# Patient Record
Sex: Male | Born: 1965 | Race: White | Hispanic: No | Marital: Married | Smoking: Current every day smoker
Health system: Southern US, Community
[De-identification: ages and names within clinical notes are randomized; demographics above are authoritative.]

## PROBLEM LIST (undated history)

## (undated) DIAGNOSIS — I10 Essential (primary) hypertension: Secondary | ICD-10-CM

## (undated) DIAGNOSIS — K7460 Unspecified cirrhosis of liver: Secondary | ICD-10-CM

---

## 2007-10-09 ENCOUNTER — Ambulatory Visit: Payer: Self-pay | Admitting: Orthopedic Surgery

## 2007-11-06 ENCOUNTER — Ambulatory Visit (HOSPITAL_COMMUNITY): Admission: RE | Admit: 2007-11-06 | Discharge: 2007-11-07 | Payer: Self-pay | Admitting: Neurological Surgery

## 2007-12-13 ENCOUNTER — Encounter: Admission: RE | Admit: 2007-12-13 | Discharge: 2007-12-13 | Payer: Self-pay | Admitting: Neurological Surgery

## 2008-02-17 ENCOUNTER — Encounter: Admission: RE | Admit: 2008-02-17 | Discharge: 2008-02-17 | Payer: Self-pay | Admitting: Neurological Surgery

## 2008-02-20 ENCOUNTER — Ambulatory Visit: Payer: Self-pay | Admitting: Neurological Surgery

## 2010-04-18 ENCOUNTER — Emergency Department: Payer: Self-pay | Admitting: Emergency Medicine

## 2010-04-25 ENCOUNTER — Ambulatory Visit: Payer: Self-pay | Admitting: General Surgery

## 2010-04-30 ENCOUNTER — Emergency Department: Payer: Self-pay | Admitting: Unknown Physician Specialty

## 2010-05-06 ENCOUNTER — Ambulatory Visit: Payer: Self-pay | Admitting: Gastroenterology

## 2010-05-09 ENCOUNTER — Ambulatory Visit: Payer: Self-pay | Admitting: Gastroenterology

## 2010-05-12 LAB — PATHOLOGY REPORT

## 2010-06-08 ENCOUNTER — Encounter: Payer: Self-pay | Admitting: Anesthesiology

## 2010-07-06 ENCOUNTER — Encounter: Payer: Self-pay | Admitting: Anesthesiology

## 2010-08-04 ENCOUNTER — Encounter: Payer: Self-pay | Admitting: Anesthesiology

## 2010-10-18 NOTE — Op Note (Signed)
Jonathon Bryant, Jonathon Bryant              ACCOUNT NO.:  [id number]   MEDICAL RECORD NO.:  [id number]          PATIENT TYPE:  AMB   LOCATION:  SDS                          FACILITY:  [facility]   PHYSICIAN:  Tia Alert, MD     DATE OF BIRTH:  10-05-1965   DATE OF PROCEDURE:  11/06/2007  DATE OF DISCHARGE:                               OPERATIVE REPORT   PREOPERATIVE DIAGNOSES:  Cervical spondylosis with cervical spinal  stenosis, C5-C6 with left C6 radiculopathy.   POSTOPERATIVE DIAGNOSES:  Cervical spondylosis with cervical spinal  stenosis, C5-C6 with left C6 radiculopathy.   PROCEDURES:  1. Decompressive anterior cervical diskectomy, C5-C6.  2. Anterior cervical arthrodesis, C5-C6 utilizing a 7-mm      corticocancellous allograft.  3. Anterior cervical plating, C5-C6 utilizing a 24-mm NuVasive helix      plate.   SURGEON:  Tia Alert, MD   ASSISTANT:  Donalee Citrin, MD   ANESTHESIA:  General endotracheal.   COMPLICATIONS:  None apparent.   INDICATIONS FOR PROCEDURE:  Jonathon Bryant is a 45 year old gentleman who  presented with severe left arm pain.  He had an MRI, which showed a  significant spondylosis with severe neural foraminal stenosis at C5-C6.  He had tried medical management for quite some time without significant  relief, recommended decompressive anterior cervical diskectomy, fusion  with plating at C5-C6.  He understood the risks, benefits, expected  outcome, and wished to proceed.   DESCRIPTION OF PROCEDURE:  The patient was taken to the operating room,  and after induction of adequate generalized endotracheal anesthesia, he  was placed in supine position on the operating room table.  His right  anterior cervical region was prepped with DuraPrep and then draped in  the usual sterile fashion.  A 5 mL of local anesthesia was injected.  A  transverse incision was made to the right of midline and carried down to  the platysma, which was elevated, opened, and undermined  with Metzenbaum  scissors.  I then dissected a plane medial to the sternocleidomastoid  muscle and internal carotid artery, and lateral to the trachea and  esophagus to expose C5-C6.  Intraoperative fluoroscopy confirmed my  level and then, the longus colli muscles were taken down.  The Shadow-  Line retractors were placed under this.  There was a large overhanging  spur from C5 over the disk space.  This was removed with a 3-mm Kerrison  punch.  The annulus was incised and initial diskectomy was done with  pituitary rongeurs and curved curettes.  I then used a high-speed drill  to drill the endplates down to the level of the posterior longitudinal  ligament, widening the disk space to 7 or 8 mm.  As I went down, I  drilled in a rectangular fashion.  Once we then brought in the operating  microscope, we used a 1- and 2-mm Kerrison punches to open the posterior  longitudinal ligament and then removed it by undercutting the bodies of  C5 and C6.  There was an osteophyte coming off the body of C5, which was  removed by undercutting  the bodies of C5.  Bilateral foraminotomies were  performed by marching along the C6 superior endplate, identifying the  pedicles, then marching along the pedicles, identifying the nerve roots,  and then following them out into their respective foramina.  Once the  depression was complete, we palpated with a black nerve hook into the  foramina and into the midline.  We could see the cord pulsatile through  the dura and the nerve roots appeared to be free.  The nerve root passed  easily along the nerve roots.  Therefore, we irrigated with saline  solution, we measured the interspace to be 7 mm, we used a 7-mm  corticocancellous allograft, and tapped this in position at C5-C6.  We  then used a 24-mm NuVasive helix plate and placed two 30-mm variable  angle screws in the bodies of C5 and C6, and these locked in the plate  by locking mechanism within the plate.  We  then irrigated with saline  solution, dried all bleeding points with bipolar cautery and Surgifoam,  placed a 7 flat JP drain through a separate stab incision, and once  meticulous hemostasis was achieved, we closed the platysma with 3-0  Vicryl, closed the subcuticular tissue with 3-0 Vicryl, and closed the  skin with benzoin and Steri-Strips.  The drapes were removed.  Sterile  dressing was applied.  The patient was awakened from general anesthesia  and transferred to recovery room in stable condition.  At the end of the  procedure, all sponge, needle, and instrument counts were correct.      Tia Alert, MD  Electronically Signed     DSJ/MEDQ  D:  11/06/2007  T:  11/07/2007  Job:  [id number]

## 2011-02-25 ENCOUNTER — Emergency Department (HOSPITAL_COMMUNITY)
Admission: EM | Admit: 2011-02-25 | Discharge: 2011-02-25 | Disposition: A | Discharge status: Home / Self Care | Payer: Worker's Compensation | Attending: Emergency Medicine | Admitting: Emergency Medicine

## 2011-02-25 ENCOUNTER — Emergency Department (HOSPITAL_COMMUNITY): Payer: Worker's Compensation

## 2011-02-25 DIAGNOSIS — Z79899 Other long term (current) drug therapy: Secondary | ICD-10-CM | POA: Insufficient documentation

## 2011-02-25 DIAGNOSIS — F17200 Nicotine dependence, unspecified, uncomplicated: Secondary | ICD-10-CM | POA: Insufficient documentation

## 2011-02-25 DIAGNOSIS — X500XXA Overexertion from strenuous movement or load, initial encounter: Secondary | ICD-10-CM | POA: Insufficient documentation

## 2011-02-25 DIAGNOSIS — G8929 Other chronic pain: Secondary | ICD-10-CM | POA: Insufficient documentation

## 2011-02-25 DIAGNOSIS — M25519 Pain in unspecified shoulder: Secondary | ICD-10-CM | POA: Insufficient documentation

## 2011-02-26 ENCOUNTER — Inpatient Hospital Stay (INDEPENDENT_AMBULATORY_CARE_PROVIDER_SITE_OTHER)
Admission: RE | Admit: 2011-02-26 | Discharge: 2011-02-26 | Disposition: A | Discharge status: Home / Self Care | Payer: Worker's Compensation | Source: Ambulatory Visit | Attending: Emergency Medicine | Admitting: Emergency Medicine

## 2011-02-26 DIAGNOSIS — M25519 Pain in unspecified shoulder: Secondary | ICD-10-CM

## 2011-03-02 LAB — BASIC METABOLIC PANEL
BUN: 14
Calcium: 9.6
Creatinine, Ser: 1.14
GFR calc non Af Amer: >60

## 2011-03-02 LAB — CBC
Platelets: 200
WBC: 11.2 — ABNORMAL HIGH

## 2011-03-02 LAB — DIFFERENTIAL
Eosinophils Absolute: 0.2
Lymphocytes Relative: 33
Lymphs Abs: 3.7
Neutrophils Relative %: 58

## 2011-03-02 LAB — PROTIME-INR
INR: 1.1
Prothrombin Time: 14.1

## 2011-07-12 ENCOUNTER — Ambulatory Visit: Payer: Self-pay | Admitting: Family Medicine

## 2012-01-15 ENCOUNTER — Ambulatory Visit: Payer: Self-pay

## 2012-04-19 ENCOUNTER — Emergency Department: Payer: Self-pay | Admitting: Emergency Medicine

## 2013-04-04 ENCOUNTER — Emergency Department: Payer: Self-pay | Admitting: Emergency Medicine

## 2013-04-04 LAB — COMPREHENSIVE METABOLIC PANEL
BUN: 6 mg/dL — ABNORMAL LOW (ref 7–18)
Creatinine: 1.03 mg/dL (ref 0.60–1.30)
EGFR (African American): >60
EGFR (Non-African Amer.): >60
Glucose: 93 mg/dL (ref 65–99)
Potassium: 3.7 mmol/L (ref 3.5–5.1)
SGOT(AST): 50 U/L — ABNORMAL HIGH (ref 15–37)

## 2013-04-04 LAB — CBC
HCT: 44.9 % (ref 40.0–52.0)
RBC: 4.53 10*6/uL (ref 4.40–5.90)

## 2014-03-23 ENCOUNTER — Emergency Department: Payer: Self-pay | Admitting: Emergency Medicine

## 2014-03-23 LAB — COMPREHENSIVE METABOLIC PANEL
ALBUMIN: 2.5 g/dL — AB (ref 3.4–5.0)
ALT: 35 U/L
AST: 61 U/L — AB (ref 15–37)
Alkaline Phosphatase: 180 U/L — ABNORMAL HIGH
Anion Gap: 7 (ref 7–16)
BUN: 8 mg/dL (ref 7–18)
Bilirubin,Total: 1.5 mg/dL — ABNORMAL HIGH (ref 0.2–1.0)
CALCIUM: 8 mg/dL — AB (ref 8.5–10.1)
CHLORIDE: 99 mmol/L (ref 98–107)
CREATININE: 0.91 mg/dL (ref 0.60–1.30)
Co2: 26 mmol/L (ref 21–32)
EGFR (African American): >60
EGFR (Non-African Amer.): >60
Glucose: 164 mg/dL — ABNORMAL HIGH (ref 65–99)
Osmolality: 266 (ref 275–301)
POTASSIUM: 3.9 mmol/L (ref 3.5–5.1)
Sodium: 132 mmol/L — ABNORMAL LOW (ref 136–145)
TOTAL PROTEIN: 6.9 g/dL (ref 6.4–8.2)

## 2014-03-23 LAB — CBC WITH DIFFERENTIAL/PLATELET
BASOS ABS: 0 10*3/uL (ref 0.0–0.1)
BASOS PCT: 0.5 %
Eosinophil #: 0.5 10*3/uL (ref 0.0–0.7)
Eosinophil %: 5.9 %
HCT: 40.1 % (ref 40.0–52.0)
HGB: 13.4 g/dL (ref 13.0–18.0)
LYMPHS PCT: 13.8 %
Lymphocyte #: 1.1 10*3/uL (ref 1.0–3.6)
MCH: 34.2 pg — ABNORMAL HIGH (ref 26.0–34.0)
MCHC: 33.5 g/dL (ref 32.0–36.0)
MCV: 102 fL — ABNORMAL HIGH (ref 80–100)
MONO ABS: 1 x10 3/mm (ref 0.2–1.0)
Monocyte %: 13.1 %
NEUTROS ABS: 5.2 10*3/uL (ref 1.4–6.5)
NEUTROS PCT: 66.7 %
Platelet: 111 10*3/uL — ABNORMAL LOW (ref 150–440)
RBC: 3.92 10*6/uL — AB (ref 4.40–5.90)
RDW: 14.6 % — AB (ref 11.5–14.5)
WBC: 7.8 10*3/uL (ref 3.8–10.6)

## 2014-03-23 LAB — LIPASE, BLOOD: LIPASE: 161 U/L (ref 73–393)

## 2014-08-30 ENCOUNTER — Observation Stay: Payer: Self-pay | Admitting: Internal Medicine

## 2014-08-30 LAB — URINALYSIS, COMPLETE
BILIRUBIN, UR: NEGATIVE
BLOOD: NEGATIVE
Bacteria: NONE SEEN
Glucose,UR: NEGATIVE mg/dL (ref 0–75)
Ketone: NEGATIVE
LEUKOCYTE ESTERASE: NEGATIVE
NITRITE: NEGATIVE
PH: 6 (ref 4.5–8.0)
Protein: NEGATIVE
RBC,UR: 1 /HPF (ref 0–5)
SQUAMOUS EPITHELIAL: NONE SEEN
Specific Gravity: 1.014 (ref 1.003–1.030)
WBC UR: <1 /HPF (ref 0–5)

## 2014-08-30 LAB — COMPREHENSIVE METABOLIC PANEL
ALBUMIN: 3.4 g/dL — AB
ALK PHOS: 186 U/L — AB
ANION GAP: 7 (ref 7–16)
BILIRUBIN TOTAL: 1.4 mg/dL — AB
BUN: 9 mg/dL
CALCIUM: 8.6 mg/dL — AB
CREATININE: 1.1 mg/dL
Chloride: 97 mmol/L — ABNORMAL LOW
Co2: 28 mmol/L
EGFR (Non-African Amer.): >60
GLUCOSE: 150 mg/dL — AB
POTASSIUM: 3.1 mmol/L — AB
SGOT(AST): 65 U/L — ABNORMAL HIGH
SGPT (ALT): 34 U/L
SODIUM: 132 mmol/L — AB
TOTAL PROTEIN: 7.4 g/dL

## 2014-08-30 LAB — CBC WITH DIFFERENTIAL/PLATELET
BASOS ABS: 0 10*3/uL (ref 0.0–0.1)
Basophil %: 0.7 %
EOS ABS: 0.3 10*3/uL (ref 0.0–0.7)
Eosinophil %: 5 %
HCT: 39.8 % — AB (ref 40.0–52.0)
HGB: 13.9 g/dL (ref 13.0–18.0)
LYMPHS ABS: 1.1 10*3/uL (ref 1.0–3.6)
LYMPHS PCT: 15.3 %
MCH: 34.4 pg — ABNORMAL HIGH (ref 26.0–34.0)
MCHC: 34.8 g/dL (ref 32.0–36.0)
MCV: 99 fL (ref 80–100)
MONO ABS: 0.8 x10 3/mm (ref 0.2–1.0)
Monocyte %: 11.1 %
NEUTROS ABS: 4.7 10*3/uL (ref 1.4–6.5)
Neutrophil %: 67.9 %
Platelet: 107 10*3/uL — ABNORMAL LOW (ref 150–440)
RBC: 4.03 10*6/uL — ABNORMAL LOW (ref 4.40–5.90)
RDW: 15.5 % — AB (ref 11.5–14.5)
WBC: 6.9 10*3/uL (ref 3.8–10.6)

## 2014-08-30 LAB — TROPONIN I

## 2014-08-30 LAB — AMMONIA: AMMONIA, PLASMA: 58 umol/L — AB

## 2014-08-30 LAB — LIPASE, BLOOD: LIPASE: 49 U/L

## 2014-08-31 LAB — CBC WITH DIFFERENTIAL/PLATELET
BASOS ABS: 0.1 10*3/uL (ref 0.0–0.1)
Basophil %: 0.8 %
EOS ABS: 0.4 10*3/uL (ref 0.0–0.7)
Eosinophil %: 5.9 %
HCT: 37.2 % — ABNORMAL LOW (ref 40.0–52.0)
HGB: 12.5 g/dL — AB (ref 13.0–18.0)
LYMPHS PCT: 15.9 %
Lymphocyte #: 1.1 10*3/uL (ref 1.0–3.6)
MCH: 33.8 pg (ref 26.0–34.0)
MCHC: 33.5 g/dL (ref 32.0–36.0)
MCV: 101 fL — ABNORMAL HIGH (ref 80–100)
MONOS PCT: 10.3 %
Monocyte #: 0.7 x10 3/mm (ref 0.2–1.0)
NEUTROS PCT: 67.1 %
Neutrophil #: 4.7 10*3/uL (ref 1.4–6.5)
Platelet: 96 10*3/uL — ABNORMAL LOW (ref 150–440)
RBC: 3.69 10*6/uL — ABNORMAL LOW (ref 4.40–5.90)
RDW: 15.7 % — AB (ref 11.5–14.5)
WBC: 7.1 10*3/uL (ref 3.8–10.6)

## 2014-08-31 LAB — BASIC METABOLIC PANEL
ANION GAP: 5 — AB (ref 7–16)
BUN: 12 mg/dL
CALCIUM: 8.4 mg/dL — AB
Chloride: 98 mmol/L — ABNORMAL LOW
Co2: 29 mmol/L
Creatinine: 1.11 mg/dL
EGFR (African American): >60
EGFR (Non-African Amer.): >60
GLUCOSE: 126 mg/dL — AB
Potassium: 3.3 mmol/L — ABNORMAL LOW
SODIUM: 132 mmol/L — AB

## 2014-10-04 NOTE — H&P (Signed)
PATIENT NAME:  Jonathon, Bryant MR#:  [id number] DATE OF BIRTH:  Oct 28, 1965  DATE OF ADMISSION:  08/30/2014    PRIMARY CARE PHYSICIAN: At [facility].   CHIEF COMPLAINT: Abdominal pain.   HISTORY OF PRESENT ILLNESS: A 49 year old Caucasian gentleman with history of NASH with subsequent cirrhosis; hypertension, essential; presenting with abdominal pain described as 1 week total duration of progressively worsening pain of the abdomen, nonradiating, described only as pain, intensity 8 to 10 out of 10. No worsening or relieving factors. No fevers, chills, or other symptomatology. The patient states he gained about 20 pounds over the last week; however, had a paracentesis of a large volume in the past performed at [facility].   REVIEW OF SYSTEMS:  CONSTITUTIONAL: Denies fevers, chills, fatigue, weakness.  EYES: Denies blurred vision, double vision, eye pain.  EARS, NOSE, AND THROAT: Denies tinnitus, ear pain, hearing loss.  RESPIRATORY: Denies cough, wheeze, shortness of breath.  CARDIOVASCULAR: Denies chest pain, palpitations, edema.  GASTROINTESTINAL: Denies nausea, vomiting, diarrhea. Positive of abdominal pain as stated above.  GENITOURINARY: Denies dysuria or hematuria.  ENDOCRINE: Denies nocturia or thyroid problems.  HEMATOLOGIC AND LYMPHATIC: Denies easy bruising or bleeding.  SKIN: Denies rash or lesions.  MUSCULOSKELETAL: Denies pain in neck, back, shoulder, knees, hips, or arthritic symptoms.  NEUROLOGIC: Denies paralysis or paresthesias. PSYCHIATRIC: Denies anxiety or depressive symptoms.   Otherwise, a full review of systems performed by me is negative.   PAST MEDICAL HISTORY: Includes NASH with subsequent cirrhosis, status post paracentesis; essential hypertension.   SOCIAL HISTORY: Positive for tobacco use. Denies any alcohol or drug use.   FAMILY HISTORY: Denies any known cardiovascular or pulmonary disorders.   ALLERGIES: No known drug allergies.   HOME MEDICATIONS: Include  gabapentin 100 mg 3 capsules 3 times daily, hydrochlorothiazide/lisinopril 12.5/20 mg p.o. daily, Norvasc 10 mg p.o. daily.   PHYSICAL EXAMINATION:  VITAL SIGNS: Temperature 98.2, heart rate 91, respirations 16, blood pressure 161/71, saturating 100% on room air. Weight 134.7 kg, BMI 41.4.  GENERAL: A well-nourished, well-developed Caucasian gentleman currently in no acute distress.  HEAD: Normocephalic, atraumatic.  EYES: Pupils equal, round, reactive to light. Extraocular muscles intact. No sclerae icterus.  MOUTH: Moist mucosal membrane. Dentition intact. No abscess noted.  EARS, NOSE, AND THROAT: Clear without exudate. No external lesions.  NECK: Supple. No thyromegaly. No nodules. No JVD.  PULMONARY: Clear to auscultation bilaterally without wheezes, rales, or rhonchi. No use of accessory muscles. Good respiratory effort.  CHEST: Nontender to palpation. CARDIOVASCULAR: S1, S2, regular rate and rhythm. No murmurs, rubs, or gallops. Trace edema to the shins bilaterally. Pedal pulses 2+ bilateral. GASTROINTESTINAL: Tense, distended. Positive fluid wave. Hypoactive bowel sounds. No appreciable hepatosplenomegaly, but difficult examination.  MUSCULOSKELETAL: No swelling, clubbing, or edema. Range of motion full in all extremities.  NEUROLOGIC: Cranial nerves II through XII. No gross focal neurological deficits. Sensation intact. Reflexes intact.  SKIN: No ulcerations, lesions, rashes, or cyanosis. Skin warm, dry. Turgor intact.  PSYCHIATRIC: Mood and affect within normal limits. The patient is awake, alert, oriented x 3. Insight and judgment intact.   LABORATORY DATA: Sodium 132, potassium 3.1, chloride 97, bicarbonate 28, BUN 9, creatinine 1.1, glucose 150. LFTs: Albumin 3.4, bilirubin 1.4, alkaline phosphatase 186, AST 65. WBC of 6.9, hemoglobin 13.9, platelets of 107,000. Urinalysis negative for evidence of infection.   CT abdomen and pelvis performed which reveals no acute findings, evidence  of cirrhosis and portal hypertension, as well as ascites.    ASSESSMENT AND PLAN: A 49 year old  Caucasian male gentleman with history of nonalcoholic steatohepatitis with subsequent cirrhosis who presents with abdominal pain.  1.  Ascites secondary to cirrhosis: We will get a therapeutic paracentesis. Provide pain medications as required. [facility] We will have him followup whenever he is discharged.   2.  Hyponatremia in the setting of volume overload: Continue with paracentesis.  3.  Hypokalemia: Replace potassium, goal 4 to 5.  4.  Hypertension, essential: Continue with Norvasc, lisinopril, hydrochlorothiazide.  5.  Venous thromboembolism prophylaxis: Sequential compression devices.   CODE STATUS: The patient is a FULL CODE.   TIME SPENT: 35 minutes.    ____________________________ Cletis Athensavid K. Anihya Tuma, MD dkh:TT D: 08/30/2014 22:13:07 ET T: 08/30/2014 22:52:02 ET JOB#: [id number]  cc: Cletis Athensavid K. Dewaun Kinzler, MD, <Dictator> Rital Cavey Synetta ShadowK Lonette Stevison MD ELECTRONICALLY SIGNED 08/31/2014 23:42

## 2014-10-04 NOTE — Discharge Summary (Signed)
PATIENT NAME:  Jonathon Bryant, Jonathon Bryant MR#:  [id number] DATE OF BIRTH:  12/29/1965  DATE OF ADMISSION:  08/30/2014 DATE OF DISCHARGE:  08/31/2014  For detailed note, please see the history and physical done on admission by Dr. Angelica Ranavid Hower.   DIAGNOSES AT DISCHARGE: As follows:  1.  Abdominal pain and distention secondary to tense ascites.  2.  Liver cirrhosis secondary to a nonalcoholic steatohepatitis. 3.  Hyponatremia. 4.  Hypokalemia. 5.  Hypertension. 6.  Neuropathy.   DIET:  The patient is being discharged on a low-sodium, low-fat diet.   ACTIVITY: As tolerated.   FOLLOW-UP:  [facility] in the next week to 2 weeks.     DISCHARGE MEDICATIONS:  Gabapentin 100 mg 3 capsules t.i.d., amlodipine 10 mg daily, hydrochlorothiazide lisinopril 12.5-20 at 1 tablet daily, Lasix 40 mg daily, Aldactone 25 mg daily and oxycodone 5 mg every 6 hours as needed.   PERTINENT STUDIES DONE DURING THE HOSPITAL COURSE:  Ultrasound-guided paracentesis done on 08/31/2014 with 2.6 liters of straw-colored fluid removed.   BRIEF HOSPITAL COURSE: This is a 49 year old male with medical problems as mentioned above, presented to the hospital with abdominal pain, distention, and noted to have tense ascites.  1.  Abdominal pain and distention. The most likely cause of this was the tense ascites. The patient was admitted to the hospital, underwent ultrasound-guided paracentesis, and his clinical symptoms have improved.  He is currently afebrile. Has no further abdominal pain.  Has no clinical evidence of spontaneous bacterial peritonitis. He apparently was not taking any diuretics prior to coming in; therefore, he was discharged on Lasix and Aldactone.  [facility] in the next few weeks.  2.  Hypertension. The patient remained hemodynamically stable.  He will continue his lisinopril, hydrochlorothiazide and Norvasc.    3.  Hypokalemia - hyponatremia. This is likely secondary to  liver cirrhosis and being on diuretics.  This has improved and can further be followed by his primary care physician as an outpatient.   CODE STATUS: The patient is a FULL CODE.   TIME SPENT: 40 minutes.     ____________________________ Rolly PancakeVivek J. Cherlynn KaiserSainani, MD vjs:DT D: 08/31/2014 16:33:57 ET T: 09/01/2014 08:10:28 ET JOB#: [id number]  cc: Rolly PancakeVivek J. Cherlynn KaiserSainani, MD, <Dictator> [facility]    Houston SirenVIVEK J SAINANI MD ELECTRONICALLY SIGNED 09/10/2014 16:19

## 2015-10-01 ENCOUNTER — Inpatient Hospital Stay: Payer: BLUE CROSS/BLUE SHIELD

## 2015-10-01 ENCOUNTER — Inpatient Hospital Stay (HOSPITAL_COMMUNITY)
Admit: 2015-10-01 | Discharge: 2015-10-01 | Disposition: A | Discharge status: Home / Self Care | Payer: BLUE CROSS/BLUE SHIELD | Attending: Pulmonary Disease | Admitting: Pulmonary Disease

## 2015-10-01 ENCOUNTER — Emergency Department: Payer: BLUE CROSS/BLUE SHIELD

## 2015-10-01 ENCOUNTER — Encounter: Payer: Self-pay | Admitting: *Deleted

## 2015-10-01 ENCOUNTER — Inpatient Hospital Stay
Admission: EM | Admit: 2015-10-01 | Discharge: 2015-11-04 | DRG: 871 | Disposition: E | Payer: BLUE CROSS/BLUE SHIELD | Attending: Pulmonary Disease | Admitting: Pulmonary Disease

## 2015-10-01 DIAGNOSIS — K559 Vascular disorder of intestine, unspecified: Secondary | ICD-10-CM | POA: Diagnosis not present

## 2015-10-01 DIAGNOSIS — J9600 Acute respiratory failure, unspecified whether with hypoxia or hypercapnia: Secondary | ICD-10-CM | POA: Diagnosis not present

## 2015-10-01 DIAGNOSIS — I4891 Unspecified atrial fibrillation: Secondary | ICD-10-CM

## 2015-10-01 DIAGNOSIS — J9601 Acute respiratory failure with hypoxia: Secondary | ICD-10-CM | POA: Diagnosis present

## 2015-10-01 DIAGNOSIS — G9341 Metabolic encephalopathy: Secondary | ICD-10-CM | POA: Diagnosis present

## 2015-10-01 DIAGNOSIS — A419 Sepsis, unspecified organism: Secondary | ICD-10-CM | POA: Diagnosis not present

## 2015-10-01 DIAGNOSIS — E875 Hyperkalemia: Secondary | ICD-10-CM | POA: Diagnosis not present

## 2015-10-01 DIAGNOSIS — F17200 Nicotine dependence, unspecified, uncomplicated: Secondary | ICD-10-CM | POA: Diagnosis present

## 2015-10-01 DIAGNOSIS — R7989 Other specified abnormal findings of blood chemistry: Secondary | ICD-10-CM | POA: Diagnosis not present

## 2015-10-01 DIAGNOSIS — R34 Anuria and oliguria: Secondary | ICD-10-CM | POA: Diagnosis not present

## 2015-10-01 DIAGNOSIS — N170 Acute kidney failure with tubular necrosis: Secondary | ICD-10-CM | POA: Diagnosis present

## 2015-10-01 DIAGNOSIS — D696 Thrombocytopenia, unspecified: Secondary | ICD-10-CM | POA: Diagnosis present

## 2015-10-01 DIAGNOSIS — A403 Sepsis due to Streptococcus pneumoniae: Principal | ICD-10-CM | POA: Diagnosis present

## 2015-10-01 DIAGNOSIS — K7460 Unspecified cirrhosis of liver: Secondary | ICD-10-CM

## 2015-10-01 DIAGNOSIS — J13 Pneumonia due to Streptococcus pneumoniae: Secondary | ICD-10-CM | POA: Diagnosis present

## 2015-10-01 DIAGNOSIS — R21 Rash and other nonspecific skin eruption: Secondary | ICD-10-CM | POA: Diagnosis not present

## 2015-10-01 DIAGNOSIS — Z452 Encounter for adjustment and management of vascular access device: Secondary | ICD-10-CM

## 2015-10-01 DIAGNOSIS — R188 Other ascites: Secondary | ICD-10-CM

## 2015-10-01 DIAGNOSIS — I10 Essential (primary) hypertension: Secondary | ICD-10-CM | POA: Diagnosis present

## 2015-10-01 DIAGNOSIS — K7581 Nonalcoholic steatohepatitis (NASH): Secondary | ICD-10-CM | POA: Diagnosis present

## 2015-10-01 DIAGNOSIS — I82409 Acute embolism and thrombosis of unspecified deep veins of unspecified lower extremity: Secondary | ICD-10-CM

## 2015-10-01 DIAGNOSIS — N179 Acute kidney failure, unspecified: Secondary | ICD-10-CM | POA: Diagnosis not present

## 2015-10-01 DIAGNOSIS — K761 Chronic passive congestion of liver: Secondary | ICD-10-CM | POA: Diagnosis present

## 2015-10-01 DIAGNOSIS — I481 Persistent atrial fibrillation: Secondary | ICD-10-CM | POA: Diagnosis not present

## 2015-10-01 DIAGNOSIS — I472 Ventricular tachycardia: Secondary | ICD-10-CM | POA: Diagnosis present

## 2015-10-01 DIAGNOSIS — Z66 Do not resuscitate: Secondary | ICD-10-CM | POA: Diagnosis not present

## 2015-10-01 DIAGNOSIS — E872 Acidosis: Secondary | ICD-10-CM | POA: Diagnosis present

## 2015-10-01 DIAGNOSIS — J918 Pleural effusion in other conditions classified elsewhere: Secondary | ICD-10-CM | POA: Diagnosis not present

## 2015-10-01 DIAGNOSIS — J90 Pleural effusion, not elsewhere classified: Secondary | ICD-10-CM

## 2015-10-01 DIAGNOSIS — D684 Acquired coagulation factor deficiency: Secondary | ICD-10-CM | POA: Diagnosis present

## 2015-10-01 DIAGNOSIS — J189 Pneumonia, unspecified organism: Secondary | ICD-10-CM

## 2015-10-01 DIAGNOSIS — R6521 Severe sepsis with septic shock: Secondary | ICD-10-CM | POA: Diagnosis present

## 2015-10-01 DIAGNOSIS — R4189 Other symptoms and signs involving cognitive functions and awareness: Secondary | ICD-10-CM

## 2015-10-01 DIAGNOSIS — J9690 Respiratory failure, unspecified, unspecified whether with hypoxia or hypercapnia: Secondary | ICD-10-CM

## 2015-10-01 DIAGNOSIS — Z6841 Body Mass Index (BMI) 40.0 and over, adult: Secondary | ICD-10-CM

## 2015-10-01 DIAGNOSIS — R6520 Severe sepsis without septic shock: Secondary | ICD-10-CM | POA: Diagnosis present

## 2015-10-01 DIAGNOSIS — E669 Obesity, unspecified: Secondary | ICD-10-CM | POA: Diagnosis present

## 2015-10-01 DIAGNOSIS — R509 Fever, unspecified: Secondary | ICD-10-CM

## 2015-10-01 HISTORY — DX: Essential (primary) hypertension: I10

## 2015-10-01 HISTORY — DX: Unspecified cirrhosis of liver: K74.60

## 2015-10-01 LAB — BLOOD GAS, ARTERIAL
ACID-BASE DEFICIT: 7.1 mmol/L — AB (ref 0.0–2.0)
ACID-BASE DEFICIT: 8.8 mmol/L — AB (ref 0.0–2.0)
ACID-BASE DEFICIT: 8.5 mmol/L — AB (ref 0.0–2.0)
Acid-base deficit: 4.1 mmol/L — ABNORMAL HIGH (ref 0.0–2.0)
Allens test (pass/fail): POSITIVE — AB
Allens test (pass/fail): POSITIVE — AB
BICARBONATE: 18.7 meq/L — AB (ref 21.0–28.0)
BICARBONATE: 20.1 meq/L — AB (ref 21.0–28.0)
BICARBONATE: 17.3 meq/L — AB (ref 21.0–28.0)
Bicarbonate: 22.2 mEq/L (ref 21.0–28.0)
Expiratory PAP: 6
FIO2: 1
FIO2: 1
FIO2: 1
FIO2: 1
Inspiratory PAP: 16
LHR: 16 {breaths}/min
LHR: 16 {breaths}/min
MECHVT: 500 mL
O2 SAT: 89.6 %
O2 SAT: 86.7 %
O2 Saturation: 99 %
O2 Saturation: 89.8 %
PATIENT TEMPERATURE: 37
PATIENT TEMPERATURE: 37
PATIENT TEMPERATURE: 37
PCO2 ART: 55 mmHg — AB (ref 32.0–48.0)
PCO2 ART: 36 mmHg (ref 32.0–48.0)
PCO2 ART: 44 mmHg (ref 32.0–48.0)
PEEP/CPAP: 10 cmH2O
PEEP/CPAP: 10 cmH2O
PEEP: 12 cmH2O
PO2 ART: 144 mmHg — AB (ref 83.0–108.0)
PO2 ART: 73 mmHg — AB (ref 83.0–108.0)
PO2 ART: 59 mmHg — AB (ref 83.0–108.0)
PRESSURE CONTROL: 20 cmH2O
Patient temperature: 37
Pressure control: 20 cmH2O
RATE: 12 resp/min
RATE: 16 resp/min
pCO2 arterial: 38 mmHg (ref 32.0–48.0)
pH, Arterial: 7.3 — ABNORMAL LOW (ref 7.350–7.450)
pH, Arterial: 7.17 — CL (ref 7.350–7.450)
pH, Arterial: 7.29 — ABNORMAL LOW (ref 7.350–7.450)
pH, Arterial: 7.31 — ABNORMAL LOW (ref 7.350–7.450)
pO2, Arterial: 64 mmHg — ABNORMAL LOW (ref 83.0–108.0)

## 2015-10-01 LAB — AMMONIA: AMMONIA: 77 umol/L — AB (ref 9–35)

## 2015-10-01 LAB — COMPREHENSIVE METABOLIC PANEL
ALBUMIN: 3.3 g/dL — AB (ref 3.5–5.0)
ALK PHOS: 63 U/L (ref 38–126)
ALT: 46 U/L (ref 17–63)
AST: 91 U/L — ABNORMAL HIGH (ref 15–41)
Anion gap: 16 — ABNORMAL HIGH (ref 5–15)
BILIRUBIN TOTAL: 6 mg/dL — AB (ref 0.3–1.2)
BUN: 33 mg/dL — ABNORMAL HIGH (ref 6–20)
CALCIUM: 8.7 mg/dL — AB (ref 8.9–10.3)
CO2: 17 mmol/L — AB (ref 22–32)
CREATININE: 1.44 mg/dL — AB (ref 0.61–1.24)
Chloride: 99 mmol/L — ABNORMAL LOW (ref 101–111)
GFR calc non Af Amer: 54 mL/min — ABNORMAL LOW (ref >60)
GLUCOSE: 90 mg/dL (ref 65–99)
Potassium: 4.6 mmol/L (ref 3.5–5.1)
SODIUM: 132 mmol/L — AB (ref 135–145)
TOTAL PROTEIN: 7.3 g/dL (ref 6.5–8.1)

## 2015-10-01 LAB — URINALYSIS COMPLETE WITH MICROSCOPIC (ARMC ONLY)
BACTERIA UA: NONE SEEN
BILIRUBIN URINE: NEGATIVE
Glucose, UA: NEGATIVE mg/dL
Ketones, ur: NEGATIVE mg/dL
Leukocytes, UA: NEGATIVE
Nitrite: NEGATIVE
PH: 6 (ref 5.0–8.0)
Protein, ur: 30 mg/dL — AB
SPECIFIC GRAVITY, URINE: 1.012 (ref 1.005–1.030)

## 2015-10-01 LAB — CBC WITH DIFFERENTIAL/PLATELET
BASOS PCT: 0 %
Basophils Absolute: 0 10*3/uL (ref 0–0.1)
EOS PCT: 0 %
Eosinophils Absolute: 0 10*3/uL (ref 0–0.7)
HEMATOCRIT: 42.9 % (ref 40.0–52.0)
HEMOGLOBIN: 14.1 g/dL (ref 13.0–18.0)
LYMPHS PCT: 2 %
Lymphs Abs: 0.6 10*3/uL — ABNORMAL LOW (ref 1.0–3.6)
MCH: 34.2 pg — AB (ref 26.0–34.0)
MCHC: 33 g/dL (ref 32.0–36.0)
MCV: 103.5 fL — ABNORMAL HIGH (ref 80.0–100.0)
MONO ABS: 0.8 10*3/uL (ref 0.2–1.0)
MONOS PCT: 3 %
NEUTROS PCT: 95 %
Neutro Abs: 26.4 10*3/uL — ABNORMAL HIGH (ref 1.4–6.5)
Platelets: 63 10*3/uL — ABNORMAL LOW (ref 150–440)
RBC: 4.14 MIL/uL — AB (ref 4.40–5.90)
RDW: 14.9 % — ABNORMAL HIGH (ref 11.5–14.5)
WBC: 27.8 10*3/uL — AB (ref 3.8–10.6)

## 2015-10-01 LAB — MRSA PCR SCREENING: MRSA by PCR: NEGATIVE

## 2015-10-01 LAB — ECHOCARDIOGRAM COMPLETE
Height: 70 in
WEIGHTICAEL: 4606.73 [oz_av]

## 2015-10-01 LAB — TROPONIN I: Troponin I: 0.05 ng/mL — ABNORMAL HIGH (ref <0.031)

## 2015-10-01 LAB — PROCALCITONIN: PROCALCITONIN: 19.05 ng/mL

## 2015-10-01 LAB — LACTIC ACID, PLASMA
LACTIC ACID, VENOUS: 7 mmol/L — AB (ref 0.5–2.0)
LACTIC ACID, VENOUS: 7.6 mmol/L — AB (ref 0.5–2.0)
LACTIC ACID, VENOUS: 7.3 mmol/L — AB (ref 0.5–2.0)

## 2015-10-01 LAB — PROTIME-INR
INR: 1.68
Prothrombin Time: 19.8 seconds — ABNORMAL HIGH (ref 11.4–15.0)

## 2015-10-01 LAB — APTT: aPTT: 29 seconds (ref 24–36)

## 2015-10-01 LAB — GLUCOSE, CAPILLARY: Glucose-Capillary: 123 mg/dL — ABNORMAL HIGH (ref 65–99)

## 2015-10-01 LAB — LIPASE, BLOOD: Lipase: 31 U/L (ref 11–51)

## 2015-10-01 LAB — TRIGLYCERIDES: Triglycerides: 112 mg/dL (ref <150)

## 2015-10-01 MED ORDER — SODIUM ACETATE 2 MEQ/ML IV SOLN
INTRAVENOUS | Status: DC
  Administered 2015-10-01: 22:00:00 via INTRAVENOUS
  Filled 2015-10-01 (×4): qty 1000

## 2015-10-01 MED ORDER — LEVOFLOXACIN IN D5W 750 MG/150ML IV SOLN
750.0000 mg | Freq: Once | INTRAVENOUS | Status: AC
  Administered 2015-10-01: 750 mg via INTRAVENOUS
  Filled 2015-10-01: qty 150

## 2015-10-01 MED ORDER — SODIUM CHLORIDE 0.9 % IV BOLUS (SEPSIS)
1000.0000 mL | Freq: Once | INTRAVENOUS | Status: AC
  Administered 2015-10-01: 1000 mL via INTRAVENOUS

## 2015-10-01 MED ORDER — FENTANYL CITRATE (PF) 100 MCG/2ML IJ SOLN: 100.0000 ug | INTRAMUSCULAR | Status: DC | PRN

## 2015-10-01 MED ORDER — LACTULOSE 10 GM/15ML PO SOLN
30.0000 g | Freq: Two times a day (BID) | ORAL | Status: DC
  Administered 2015-10-01 – 2015-10-04 (×6): 30 g
  Filled 2015-10-01 (×7): qty 60

## 2015-10-01 MED ORDER — FUROSEMIDE 10 MG/ML IJ SOLN
20.0000 mg | Freq: Once | INTRAMUSCULAR | Status: AC
  Administered 2015-10-01: 20 mg via INTRAVENOUS
  Filled 2015-10-01: qty 4

## 2015-10-01 MED ORDER — FENTANYL CITRATE (PF) 100 MCG/2ML IJ SOLN
100.0000 ug | INTRAMUSCULAR | Status: DC | PRN
  Administered 2015-10-01: 100 ug via INTRAVENOUS

## 2015-10-01 MED ORDER — MIDAZOLAM HCL 2 MG/2ML IJ SOLN: 2.0000 mg | INTRAMUSCULAR | Status: DC | PRN

## 2015-10-01 MED ORDER — ANTISEPTIC ORAL RINSE SOLUTION (CORINZ)
7.0000 mL | Freq: Four times a day (QID) | OROMUCOSAL | Status: DC
  Administered 2015-10-01 – 2015-10-09 (×31): 7 mL via OROMUCOSAL
  Filled 2015-10-01 (×14): qty 7

## 2015-10-01 MED ORDER — SODIUM BICARBONATE 8.4 % IV SOLN
INTRAVENOUS | Status: AC
  Filled 2015-10-01: qty 100

## 2015-10-01 MED ORDER — FENTANYL CITRATE (PF) 100 MCG/2ML IJ SOLN: 100.0000 ug | Freq: Once | INTRAMUSCULAR | Status: DC

## 2015-10-01 MED ORDER — NITROGLYCERIN IN D5W 200-5 MCG/ML-% IV SOLN
0.0000 ug/min | INTRAVENOUS | Status: DC
  Administered 2015-10-01: 5 ug/min via INTRAVENOUS
  Filled 2015-10-01: qty 250

## 2015-10-01 MED ORDER — PIPERACILLIN-TAZOBACTAM 3.375 G IVPB
3.3750 g | Freq: Once | INTRAVENOUS | Status: DC
  Administered 2015-10-01: 3.375 g via INTRAVENOUS
  Filled 2015-10-01: qty 50

## 2015-10-01 MED ORDER — DOCUSATE SODIUM 50 MG/5ML PO LIQD: 100.0000 mg | Freq: Two times a day (BID) | ORAL | Status: DC | PRN

## 2015-10-01 MED ORDER — LACTATED RINGERS IV SOLN: INTRAVENOUS | Status: DC

## 2015-10-01 MED ORDER — FENTANYL CITRATE (PF) 100 MCG/2ML IJ SOLN
100.0000 ug | Freq: Once | INTRAMUSCULAR | Status: AC
  Administered 2015-10-01: 100 ug via INTRAVENOUS

## 2015-10-01 MED ORDER — VANCOMYCIN HCL 10 G IV SOLR
1250.0000 mg | Freq: Two times a day (BID) | INTRAVENOUS | Status: DC
  Administered 2015-10-01 – 2015-10-02 (×2): 1250 mg via INTRAVENOUS
  Filled 2015-10-01 (×3): qty 1250

## 2015-10-01 MED ORDER — VITAL HIGH PROTEIN PO LIQD: 1000.0000 mL | ORAL | Status: DC

## 2015-10-01 MED ORDER — METOPROLOL TARTRATE 5 MG/5ML IV SOLN
2.5000 mg | INTRAVENOUS | Status: DC | PRN
  Administered 2015-10-02 – 2015-10-05 (×3): 5 mg via INTRAVENOUS
  Administered 2015-10-05: 2.5 mg via INTRAVENOUS
  Administered 2015-10-05 – 2015-10-07 (×4): 5 mg via INTRAVENOUS
  Filled 2015-10-01 (×8): qty 5

## 2015-10-01 MED ORDER — AMIODARONE HCL IN DEXTROSE 360-4.14 MG/200ML-% IV SOLN
30.0000 mg/h | INTRAVENOUS | Status: DC
  Filled 2015-10-01 (×4): qty 200

## 2015-10-01 MED ORDER — ETOMIDATE 2 MG/ML IV SOLN
30.0000 mg | Freq: Once | INTRAVENOUS | Status: AC
  Administered 2015-10-01: 30 mg via INTRAVENOUS

## 2015-10-01 MED ORDER — FENTANYL 2500MCG IN NS 250ML (10MCG/ML) PREMIX INFUSION
0.0000 ug/h | INTRAVENOUS | Status: DC
  Administered 2015-10-01: 50 ug/h via INTRAVENOUS
  Administered 2015-10-02 – 2015-10-04 (×6): 175 ug/h via INTRAVENOUS
  Administered 2015-10-05: 300 ug/h via INTRAVENOUS
  Administered 2015-10-05: 250 ug/h via INTRAVENOUS
  Administered 2015-10-06 – 2015-10-07 (×4): 300 ug/h via INTRAVENOUS
  Filled 2015-10-01 (×13): qty 250

## 2015-10-01 MED ORDER — PROPOFOL 1000 MG/100ML IV EMUL: 0.0000 ug/kg/min | INTRAVENOUS | Status: DC

## 2015-10-01 MED ORDER — VECURONIUM BROMIDE 10 MG IV SOLR
10.0000 mg | Freq: Once | INTRAVENOUS | Status: DC
  Administered 2015-10-01: 10 mg via INTRAVENOUS
  Filled 2015-10-01: qty 10

## 2015-10-01 MED ORDER — ASPIRIN 325 MG PO TABS
325.0000 mg | ORAL_TABLET | Freq: Every day | ORAL | Status: DC
  Administered 2015-10-02: 325 mg
  Filled 2015-10-01 (×2): qty 1

## 2015-10-01 MED ORDER — AMIODARONE LOAD VIA INFUSION
150.0000 mg | Freq: Once | INTRAVENOUS | Status: AC
  Administered 2015-10-01: 150 mg via INTRAVENOUS

## 2015-10-01 MED ORDER — SODIUM BICARBONATE 8.4 % IV SOLN
100.0000 meq | Freq: Once | INTRAVENOUS | Status: AC
  Administered 2015-10-01: 100 meq via INTRAVENOUS

## 2015-10-01 MED ORDER — MIDAZOLAM HCL 2 MG/2ML IJ SOLN
2.0000 mg | INTRAMUSCULAR | Status: DC | PRN
  Administered 2015-10-01 – 2015-10-05 (×10): 2 mg via INTRAVENOUS
  Administered 2015-10-06 (×3): 4 mg via INTRAVENOUS
  Administered 2015-10-07: 2 mg via INTRAVENOUS
  Administered 2015-10-07 (×2): 4 mg via INTRAVENOUS
  Filled 2015-10-01: qty 2
  Filled 2015-10-01 (×2): qty 4
  Filled 2015-10-01 (×2): qty 2
  Filled 2015-10-01 (×4): qty 4
  Filled 2015-10-01 (×4): qty 2
  Filled 2015-10-01 (×2): qty 4

## 2015-10-01 MED ORDER — SENNOSIDES 8.8 MG/5ML PO SYRP: 5.0000 mL | ORAL_SOLUTION | Freq: Two times a day (BID) | ORAL | Status: DC | PRN

## 2015-10-01 MED ORDER — SUCCINYLCHOLINE CHLORIDE 20 MG/ML IJ SOLN
150.0000 mg | Freq: Once | INTRAMUSCULAR | Status: AC
  Administered 2015-10-01: 150 mg via INTRAVENOUS

## 2015-10-01 MED ORDER — PROPOFOL 1000 MG/100ML IV EMUL
0.0000 ug/kg/min | INTRAVENOUS | Status: DC
  Administered 2015-10-01: 50 ug/kg/min via INTRAVENOUS
  Administered 2015-10-01: 39.816 ug/kg/min via INTRAVENOUS
  Filled 2015-10-01 (×2): qty 100

## 2015-10-01 MED ORDER — BISACODYL 10 MG RE SUPP: 10.0000 mg | Freq: Every day | RECTAL | Status: DC | PRN

## 2015-10-01 MED ORDER — PROPOFOL 1000 MG/100ML IV EMUL
INTRAVENOUS | Status: AC
  Administered 2015-10-01 (×2): 20 ug/kg/min
  Administered 2015-10-01: 10 ug/kg/min
  Filled 2015-10-01: qty 100

## 2015-10-01 MED ORDER — SODIUM CHLORIDE 0.9 % IV SOLN: 250.0000 mL | INTRAVENOUS | Status: DC | PRN

## 2015-10-01 MED ORDER — DILTIAZEM HCL 25 MG/5ML IV SOLN
INTRAVENOUS | Status: AC
  Administered 2015-10-01: 15 mg via INTRAVENOUS
  Filled 2015-10-01: qty 5

## 2015-10-01 MED ORDER — CHLORHEXIDINE GLUCONATE 0.12% ORAL RINSE (MEDLINE KIT)
15.0000 mL | Freq: Two times a day (BID) | OROMUCOSAL | Status: DC
  Administered 2015-10-01 – 2015-10-09 (×16): 15 mL via OROMUCOSAL
  Filled 2015-10-01 (×9): qty 15

## 2015-10-01 MED ORDER — VANCOMYCIN HCL IN DEXTROSE 1-5 GM/200ML-% IV SOLN
1000.0000 mg | Freq: Once | INTRAVENOUS | Status: AC
  Administered 2015-10-01: 1000 mg via INTRAVENOUS
  Filled 2015-10-01: qty 200

## 2015-10-01 MED ORDER — DEXTROSE 5 % IV SOLN
2.0000 g | Freq: Once | INTRAVENOUS | Status: AC
  Administered 2015-10-01: 2 g via INTRAVENOUS
  Filled 2015-10-01: qty 2

## 2015-10-01 MED ORDER — DEXTROSE 5 % IV SOLN: 2.0000 g | INTRAVENOUS | Status: DC

## 2015-10-01 MED ORDER — FENTANYL BOLUS VIA INFUSION
100.0000 ug | INTRAVENOUS | Status: DC | PRN
  Filled 2015-10-01: qty 100

## 2015-10-01 MED ORDER — FENTANYL CITRATE (PF) 100 MCG/2ML IJ SOLN
50.0000 ug | INTRAMUSCULAR | Status: DC | PRN
  Administered 2015-10-02: 50 ug via INTRAVENOUS

## 2015-10-01 MED ORDER — AMIODARONE HCL IN DEXTROSE 360-4.14 MG/200ML-% IV SOLN
60.0000 mg/h | INTRAVENOUS | Status: AC
  Administered 2015-10-01: 60 mg/h via INTRAVENOUS
  Filled 2015-10-01: qty 200

## 2015-10-01 MED ORDER — PANTOPRAZOLE SODIUM 40 MG IV SOLR
40.0000 mg | Freq: Every day | INTRAVENOUS | Status: DC
  Administered 2015-10-01: 40 mg via INTRAVENOUS
  Filled 2015-10-01: qty 40

## 2015-10-01 MED ORDER — MIDAZOLAM HCL 2 MG/2ML IJ SOLN
2.0000 mg | INTRAMUSCULAR | Status: DC | PRN
  Filled 2015-10-01: qty 4

## 2015-10-01 MED ORDER — DILTIAZEM HCL 25 MG/5ML IV SOLN
15.0000 mg | Freq: Once | INTRAVENOUS | Status: DC
  Administered 2015-10-01: 15 mg via INTRAVENOUS

## 2015-10-01 MED ORDER — SODIUM BICARBONATE 8.4 % IV SOLN
INTRAVENOUS | Status: DC
  Filled 2015-10-01 (×2): qty 1000

## 2015-10-01 MED ORDER — DILTIAZEM HCL 25 MG/5ML IV SOLN
10.0000 mg | Freq: Once | INTRAVENOUS | Status: DC
  Administered 2015-10-01: 10 mg via INTRAVENOUS

## 2015-10-01 MED ORDER — LEVOFLOXACIN IN D5W 750 MG/150ML IV SOLN
750.0000 mg | INTRAVENOUS | Status: DC
  Administered 2015-10-02 – 2015-10-04 (×2): 750 mg via INTRAVENOUS
  Filled 2015-10-01 (×3): qty 150

## 2015-10-01 MED ORDER — SODIUM BICARBONATE 8.4 % IV SOLN: INTRAVENOUS | Status: DC

## 2015-10-01 MED ORDER — PHENYLEPHRINE HCL 10 MG/ML IJ SOLN
0.0000 ug/min | INTRAVENOUS | Status: DC
  Administered 2015-10-01: 10 ug/min via INTRAVENOUS
  Filled 2015-10-01: qty 4

## 2015-10-01 NOTE — Consult Note (Signed)
MEDICATION RELATED CONSULT NOTE - INITIAL   Pharmacy Consult for amiodarone drug interaction Indication: amiodarone  No Known Allergies  Patient Measurements: Height: 5\' 10"  (177.8 cm) Weight: 287 lb 14.7 oz (130.6 kg) IBW/kg (Calculated) : 73 Adjusted Body Weight:   Vital Signs: Temp: 98.7 F (37.1 C) (04/28 1118) Temp Source: Axillary (04/28 1118) BP: 133/75 mmHg (04/28 1400) Pulse Rate: 154 (04/28 1400) Intake/Output from previous day:   Intake/Output from this shift: Total I/O In: -  Out: 200 [Urine:200]  Labs:  Recent Labs  09/12/2015 1115  WBC 27.8*  HGB 14.1  HCT 42.9  PLT 63*  APTT 29  CREATININE 1.44*  ALBUMIN 3.3*  PROT 7.3  AST 91*  ALT 46  ALKPHOS 63  BILITOT 6.0*   Estimated Creatinine Clearance: 81.5 mL/min (by C-G formula based on Cr of 1.44).   Microbiology: No results found for this or any previous visit (from the past 720 hour(s)).  Medical History: Past Medical History  Diagnosis Date  . Hypertension   . Cirrhosis (HCC)     Medications:  Scheduled:  . amiodarone  150 mg Intravenous Once  . aspirin  325 mg Per Tube Daily  . feeding supplement (VITAL HIGH PROTEIN)  1,000 mL Per Tube Q24H  . lactulose  30 g Per Tube BID  . pantoprazole (PROTONIX) IV  40 mg Intravenous QHS    Assessment: Pt currently on amiodarone. Pharmacy consulted to monitor for potential drug interactions  Goal of Therapy:    Plan:  Currently patient is not on any medications that interact with amio. Will continue to monitor.  Olene FlossMelissa D Adhrit Krenz, Pharm.D Clinical Pharmacist   09/22/2015,2:19 PM

## 2015-10-01 NOTE — ED Notes (Signed)
Attempted to call report; RN in contact room and unable to take report.  Will call back.

## 2015-10-01 NOTE — ED Notes (Signed)
Pt arrives via EMS from home with resp distress, EMS reports pt 02 sat 72% on RA, pt placed on cpap, arrives on C\pap, alert, states hx of cirhossis and HTN, states over the past few days pt has become more SOB, confused, and lower ext swelling, nitro past on pts chest upon arrival, MD at bedside, RT at bedside

## 2015-10-01 NOTE — Progress Notes (Addendum)
Pt seen in ER, ED MD currently placing central line, s/p intubation. Pt noted to be asynchronous with vent, and tachyneic. Agree with vecuronium to facilitate paralysis for central line. Asked RN to titrate upwards on propofol. Wife updated at bedside.   Wells Guileseep Aksh Swart, M.D.   2016/03/30

## 2015-10-01 NOTE — Progress Notes (Signed)
Brief Nutrition Note  Consult received for enteral/tube feeding initiation and management.  Adult Enteral Nutrition Protocol initiated. Full assessment to follow.  Admitting Dx: Acute respiratory failure with hypoxia (HCC) [J96.01] HCAP (healthcare-associated pneumonia) [J18.9] Ascites [R18.8] Sepsis due to pneumonia (HCC) [J18.9, A41.9] Atrial fibrillation, unspecified type (HCC) [I48.91]  Body mass index is 41.31 kg/(m^2). Pt meets criteria for obesity unspecified based on current BMI.  Labs:   Recent Labs Lab 09/19/2015 1115  NA 132*  K 4.6  CL 99*  CO2 17*  BUN 33*  CREATININE 1.44*  CALCIUM 8.7*  GLUCOSE 90    758 High DriveCate Jenasia Dolinar MS, RD, LDN [phone] Pager  [phone] Weekend/On-Call Pager

## 2015-10-01 NOTE — ED Provider Notes (Addendum)
[facility] Emergency Department Provider Note  ____________________________________________  Time seen: Approximately 11:26 AM  I have reviewed the triage vital signs and the nursing notes.   HISTORY  Chief Complaint Respiratory Distress  Caveat-history of present illness review of systems Limited due to the patient's severity of illness/severe respiratory distress.  HPI Jonathon Bryant is a 50 y.o. male with history of hypertension and cirrhosis with ascites who presents for evaluation of 2 days of shortness of breath, cough, gradual onset, constant since onset, severe. EMS was called and on their arrival, the patient had oxygen saturation in the 70s which improved with CPAP. No other history is available at this time.   Past Medical History  Diagnosis Date  . Hypertension   . Cirrhosis [facility](HCC)     Patient Active Problem List   Diagnosis Date Noted  . Severe sepsis (HCC) 07-Apr-2016    History reviewed. No pertinent past surgical history.  No current outpatient prescriptions on file.  Allergies Review of patient's allergies indicates no known allergies.  History reviewed. No pertinent family history.  Social History Social History  Substance Use Topics  . Smoking status: Current Every Day Smoker  . Smokeless tobacco: None  . Alcohol Use: None    Review of Systems  Caveat-history of present illness review of systems Limited due to the patient's severity of illness/severe respiratory distress. ____________________________________________   PHYSICAL EXAM:  VITAL SIGNS: ED Triage Vitals  Enc Vitals Group     BP 10-21-2015 1118 182/88 mmHg     Pulse Rate 10-21-2015 1115 134     Resp 10-21-2015 1115 40     Temp 10-21-2015 1118 98.7 F (37.1 C)     Temp Source 10-21-2015 1118 Axillary     SpO2 10-21-2015 1115 98 %     Weight 10-21-2015 1118 287 lb 14.7 oz (130.6 kg)     Height 10-21-2015 1118 5\' 10"  (1.778 m)     Head Cir --      Peak Flow --       Pain Score 10-21-2015 1119 4     Pain Loc --      Pain Edu? --      Excl. in GC? --     Constitutional: Alert and oriented.In severe respiratory distress able to only speak in very short phrases. Eyes: Conjunctivae are normal. PERRL. EOMI. Head: Atraumatic. Nose: No congestion/rhinnorhea. Mouth/Throat: Mucous membranes are dry.  Oropharynx non-erythematous. Neck: No stridor.  Supple without meningismus. Cardiovascular:  tachycardicrate, regular rhythm. Grossly normal heart sounds.  Good peripheral circulation. Respiratory: severe tachypnea with increased work of breathing, faint crackles in the bases bilaterally. Gastrointestinal: Soft nontender but mildly distended likely secondary to ascitic fluid, nontender reducible umbilical hernia. No CVA tenderness. Genitourinary:  Deferred Musculoskeletal: 1+ pitting edema bilateral lower extremity. No joint effusions. Neurologic:  Normal speech and language. No gross focal neurologic deficits are appreciated.  Skin:  Skin is warm, dry and intact. No rash noted. Psychiatric: Mood and affect are normal. Speech and behavior are normal.  ____________________________________________   LABS (all labs ordered are listed, but only abnormal results are displayed)  Labs Reviewed  CBC WITH DIFFERENTIAL/PLATELET - Abnormal; Notable for the following:    WBC 27.8 (*)    RBC 4.14 (*)    MCV 103.5 (*)    MCH 34.2 (*)    RDW 14.9 (*)    Platelets 63 (*)    Neutro Abs 26.4 (*)    Lymphs Abs 0.6 (*)  All other components within normal limits  COMPREHENSIVE METABOLIC PANEL - Abnormal; Notable for the following:    Sodium 132 (*)    Chloride 99 (*)    CO2 17 (*)    BUN 33 (*)    Creatinine, Ser 1.44 (*)    Calcium 8.7 (*)    Albumin 3.3 (*)    AST 91 (*)    Total Bilirubin 6.0 (*)    GFR calc non Af Amer 54 (*)    Anion gap 16 (*)    All other components within normal limits  TROPONIN I - Abnormal; Notable for the following:    Troponin I 0.05  (*)    All other components within normal limits  PROTIME-INR - Abnormal; Notable for the following:    Prothrombin Time 19.8 (*)    All other components within normal limits  LACTIC ACID, PLASMA - Abnormal; Notable for the following:    Lactic Acid, Venous 7.0 (*)    All other components within normal limits  BLOOD GAS, ARTERIAL - Abnormal; Notable for the following:    pH, Arterial 7.30 (*)    pO2, Arterial 144 (*)    Bicarbonate 18.7 (*)    Acid-base deficit 7.1 (*)    Allens test (pass/fail) POSITIVE (*)    All other components within normal limits  AMMONIA - Abnormal; Notable for the following:    Ammonia 77 (*)    All other components within normal limits  URINALYSIS COMPLETEWITH MICROSCOPIC ([facility] ONLY) - Abnormal; Notable for the following:    Color, Urine AMBER (*)    APPearance CLEAR (*)    Hgb urine dipstick 2+ (*)    Protein, ur 30 (*)    Squamous Epithelial / LPF 0-5 (*)    All other components within normal limits  GLUCOSE, CAPILLARY - Abnormal; Notable for the following:    Glucose-Capillary 123 (*)    All other components within normal limits  CULTURE, BLOOD (ROUTINE X 2)  CULTURE, BLOOD (ROUTINE X 2)  CULTURE, RESPIRATORY (NON-EXPECTORATED)  LIPASE, BLOOD  APTT  PROCALCITONIN  TRIGLYCERIDES  LACTIC ACID, PLASMA  LACTIC ACID, PLASMA   ____________________________________________  EKG  ED ECG REPORT I, Gayla Doss, the attending physician, personally viewed and interpreted this ECG.   Date: Oct 24, 2015  EKG Time: 11:15  Rate: 134  Rhythm: sinus tachycardia  Axis: normal  Intervals:none  ST&T Change: No acute ST elevation. PVCs.   ED ECG REPORT I, Gayla Doss, the attending physician, personally viewed and interpreted this ECG.   Date: Oct 24, 2015  EKG Time: 12:29  Rate: 169  Rhythm: atrial fibrillation, rate 169  Axis: left  Intervals:none  ST&T Change: No acute ST elevation. ST depression in the inferior leads, V3, V4, V5,  V6.  ____________________________________________  RADIOLOGY  CXR IMPRESSION: 1. Degraded examination with development of bibasilar heterogeneous airspace opacities, right greater than left, worrisome for multifocal infection. 2. Cardiomegaly and pulmonary venous congestion without frank evidence of edema.   CXR IMPRESSION: 1. Endotracheal tube well positioned with tip just above the level of the carina. 2. Right-sided central line well positioned with tip at the level of the mid SVC. 3. No procedural related complication seen.  Abdominal plain films IMPRESSION: Orogastric tube projects over the stomach. ____________________________________________   PROCEDURES  Procedure(s) performed:   INTUBATION Performed by: Toney Rakes A  Required items: required blood products, implants, devices, and special equipment available Patient identity confirmed: provided demographic data and hospital-assigned identification number Time out:  Immediately prior to procedure a "time out" was called to verify the correct patient, procedure, equipment, support staff and site/side marked as required.  Indications: Hypoxic respiratory failure  Intubation method: Glidescope Laryngoscopy   Preoxygenation: BVM  Sedatives: Etomidate Paralytic: Succinylcholine  Tube Size: 8.0 cuffed  Post-procedure assessment: chest rise and ETCO2 monitor Breath sounds: equal and absent over the epigastrium Tube secured with: ETT holder Chest x-ray interpreted by radiologist and me.  Chest x-ray findings: endotracheal tube in appropriate position  Patient tolerated the procedure well with no immediate complications.  CENTRAL LINE Performed by: Toney Rakes A Consent: The procedure was performed in an emergent situation. Required items: required blood products, implants, devices, and special equipment available Patient identity confirmed: arm band and provided demographic data Time out: Immediately  prior to procedure a "time out" was called to verify the correct patient, procedure, equipment, support staff and site/side marked as required. Indications: vascular access Anesthesia: local infiltration Local anesthetic: lidocaine 1% with epinephrine Anesthetic total: 3 ml Patient sedated: no Preparation: skin prepped with 2% chlorhexidine Skin prep agent dried: skin prep agent completely dried prior to procedure Sterile barriers: all five maximum sterile barriers used - cap, mask, sterile gown, sterile gloves, and large sterile sheet Hand hygiene: hand hygiene performed prior to central venous catheter insertion  Location details: right IJ  Catheter type: triple lumen Catheter size: 8 Fr Pre-procedure: landmarks identified Ultrasound guidance: yes Successful placement: yes Post-procedure: line sutured and dressing applied Assessment: blood return through all parts, free fluid flow, placement verified by x-ray and no pneumothorax on x-ray Patient tolerance: Patient tolerated the procedure well with no immediate complications.    Critical Care performed: Yes, see critical care note(s). Total Critical care time spent 60 minutes.  ____________________________________________   INITIAL IMPRESSION / ASSESSMENT AND PLAN / ED COURSE  Pertinent labs & imaging results that were available during my care of the patient were reviewed by me and considered in my medical decision making (see chart for details).  Jonathon Bryant is a 50 y.o. male with history of hypertension and cirrhosis with ascites who presents for evaluation of 2 days of shortness of breath. On arrival to the emergency department, he is in severe respiratory distress on BiPAP, we'll continue this. Clinically, his exam is concerning for CHF exacerbation so will start nitroglycerin, give Lasix, we'll obtain screening labs, blood cultures, venous lactic acid, anticipate  admission.   ----------------------------------------- 11:59 AM on Oct 27, 2015 ----------------------------------------- Chest x-ray shows vascular congestion without overt edema. Chest x-rays concerning for multifocal pneumonia and family is at bedside now to provide history and reports that the patient has had fever and cough for the past 2 days so will discontinue Lasix, nitro drip, give IV fluids, IV vancomycin, Zosyn and Levaquin for treatment of HCAP sepsis. We'll not give full 30 mL/kg bolus of normal saline given that he has basilar congestion on chest x-ray and due to risk of precipitating flash pulmonary edema.  ----------------------------------------- 1:22 PM on 2015-10-27 -----------------------------------------  I discussed the case with Dr. Nicholos Johns of the ICU and given the patient's persistently increased work of breathing without improvement, we agreed that it was in the patient's best interest to intubate at this time.  He was consented for intubation and central line was placed. Immediately following the intubation, patient he had a run of nonsustained ventricular tachycardia which evolved into atrial fibrillation with RVR for which he is receiving Cardizem. He has been very difficult to sedate, requiring large boluses of propofol  and has required vecuronium in the ER. Dr. Nicholos Johns to admit.  ED Sepsis - Repeat Assessment   Performed at: 15:15     Last Vitals:Blood pressure 113/59, pulse 134, temperature 98.7 F (37.1 C), temperature source Axillary, resp. rate 29, height  (1.778 m), weight 287 lb 14.7 oz (130.6 kg), SpO2 93 %.  Heart:      Mildly tachycardic rate, regular rhythm.  Lungs:     To this breath sounds throughout all lung fields.  Capillary Refill:   Brisk capillary refill.  Peripheral Pulse (include location): radial   Skin (include color):   Skin is pink, warm, and well perfused. ____________________________________________   FINAL  CLINICAL IMPRESSION(S) / ED DIAGNOSES  Final diagnoses:  Acute respiratory failure with hypoxia (HCC)  HCAP (healthcare-associated pneumonia)  Sepsis due to pneumonia [facility])  Atrial fibrillation, unspecified type (HCC)      Gayla Doss, MD 09/15/2015 7829  Gayla Doss, MD 09/16/2015 5621

## 2015-10-01 NOTE — Progress Notes (Signed)
eLink Physician-Brief Progress Note Patient Name: Jonathon MusicStephen E Kallal DOB: 11/03/1965 MRN: [id number]   Date of Service  07-04-15  HPI/Events of Note    eICU Interventions  Serial lactate ordered, will follow Note also on PEEP 10, consider wean based on CXR, repeat ABG     Intervention Category Intermediate Interventions: Other:  Tiegan Jambor S. 07-04-15, 4:18 PM

## 2015-10-01 NOTE — Progress Notes (Signed)
NP on unit updated about Pt's critical lactic acid of 7.3. Awaiting new orders. Will continue to monitor

## 2015-10-01 NOTE — H&P (Signed)
PULMONARY / CRITICAL CARE MEDICINE   Name: Jonathon Bryant MRN: [id number] DOB: 12/29/1965    ADMISSION DATE:  27-Jan-2016   PT PROFILE:   [age] M with Htn, NASH admitted with fever, acidosis, AMS, severe sepsis, presumed RLL PNA. Intubated in ED. Developed AFRVR in ED  MAJOR EVENTS/TEST RESULTS: 04/28 Admitted with diagnoses of severe sepsis, acute respiratory failure, suspected PNA, h/o NASH. Developed AFRVR in ED and suffered NSVT while CVL being placed 04/28 Abdominal US: No ascites seen  INDWELLING DEVICES:: ETT 04/28 >>  R IJ CVL 04/28 >>   MICRO DATA: Urine 04/28 >>   Resp  04/28 >>  Blood 04/28 >>   ANTIMICROBIALS:  Vanc 04/28 >>  Levofloxacin 04/28 >>   HISTORY OF PRESENT ILLNESS:   Obtained from wife. Pt was in [place] on the day prior to admission and went to work but developed fever in the evening. On the morning of admission, he awoke with increased dyspnea and AMS. [facility] ED where he was in respiratory distress and placed on BIPAP transiently but subsequently intubated due to persistent elevation of dyspnea.   PAST MEDICAL HISTORY :  He  has a past medical history of Hypertension and Cirrhosis (HCC).  PAST SURGICAL HISTORY: He  has no past surgical history on file.  No Known Allergies  No current facility-administered medications on file prior to encounter.   No current outpatient prescriptions on file prior to encounter.   Gabapentin Trazodone Amlodipine Sertraline Spironolactone Metolazone Furosemide  FAMILY HISTORY:  His has no family status information on file.   SOCIAL HISTORY: He  reports that he has been smoking.  He does not have any smokeless tobacco history on file.  REVIEW OF SYSTEMS:   Level 5 caveat  SUBJECTIVE:    VITAL SIGNS: BP 113/59 mmHg  Pulse 134  Temp(Src) 98.7 F (37.1 C) (Axillary)  Resp 29  Ht 5\' 10"  (1.778 m)  Wt 130.6 kg (287 lb 14.7 oz)  BMI 41.31 kg/m2  SpO2 93%  HEMODYNAMICS:    VENTILATOR  SETTINGS: Vent Mode:  [-] PRVC FiO2 (%):  [100 %] 100 % Set Rate:  [24 bmp] 24 bmp Vt Set:  [500 mL] 500 mL PEEP:  [5 cmH20] 5 cmH20  INTAKE / OUTPUT:    PHYSICAL EXAMINATION: General: Obese, RASS -4, intubated and sedated/NMB Neuro: Limited exam due to NMB, PERRL HEENT: NCAT Cardiovascular: tachy, IRIR, no M Lungs: No wheezes, BS full Abdomen: obese, soft, ND, diminished BS Ext: warm, no edema Skin: limited exam, no lesions noted  LABS:  BMET  Recent Labs Lab 12-24-15 1115  NA 132*  K 4.6  CL 99*  CO2 17*  BUN 33*  CREATININE 1.44*  GLUCOSE 90    Electrolytes  Recent Labs Lab 12-24-15 1115  CALCIUM 8.7*    CBC  Recent Labs Lab 12-24-15 1115  WBC 27.8*  HGB 14.1  HCT 42.9  PLT 63*    Coag's  Recent Labs Lab 12-24-15 1115  APTT 29  INR 1.68    Sepsis Markers  Recent Labs Lab 12-24-15 1115  LATICACIDVEN 7.0*  PROCALCITON 19.05    ABG  Recent Labs Lab 12-24-15 1123  PHART 7.30*  PCO2ART 38  PO2ART 144*    Liver Enzymes  Recent Labs Lab 12-24-15 1115  AST 91*  ALT 46  ALKPHOS 63  BILITOT 6.0*  ALBUMIN 3.3*    Cardiac Enzymes  Recent Labs Lab 12-24-15 1115  TROPONINI 0.05*  Glucose  Recent Labs Lab 09/28/2015 1535  GLUCAP 123*    CXR: initial film c/w RLL infiltrate. Subsequent film c/w RLL collapse   ASSESSMENT / PLAN:  PULMONARY A:  Acute hypoxic respiratory failure RLL infiltrate - suspected CAP Smoker without h/o COPD P:   Vent settings established Vent bundle implemented Daily SBT as indicated  CARDIOVASCULAR A:  New onset AFRVR Transient NSVT during CVL placement H/O hypertension P:  Monitor rhythm and BP Amiodarone gtt initiated 04/28 Cycle cardiac markers, EKGs TTE ordered  RENAL A:   AKI - baseline Cr 1.11 P:   Monitor BMET intermittently Monitor I/Os Correct electrolytes as indicated  GASTROINTESTINAL A:   Obesity NASH P:   SUP: IV PPI TF protocol  04/28  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 P:  DVT px: SCDs Monitor CBC intermittently Transfuse per usual guidelines  INFECTIOUS A:   Severe sepsis Suspected RLL CAP P:   Monitor temp, WBC count Micro and abx as above  ENDOCRINE A:   No issues   P:   Monitor CBGs Consider SSI if glu > 180  NEUROLOGIC A:   Acute encephalopathy Hyperammonemia  P:   RASS goal: -1, -2 PAD protocol 04/28 - propofol, PRN fentanyl, PRN midaz   FAMILY  - Updates: Wife updated in detail @ bedside  CCM time: 45 mins The above time includes time spent in consultation with patient and/or family members and reviewing care plan on multidisciplinary rounds  Billy Fischer, MD PCCM service Mobile [phone] Pager [phone]  10/03/2015, 4:28 PM

## 2015-10-01 NOTE — Progress Notes (Signed)
Pt care assumed upon patients arrival to CCU 15.  Pt intubated and sedated with Diprovan.  Pt with a-fib noted on cardiac monitor.  Amiodarone gtt in place at 60mg /hr.  Pt has right IJ TL Central line, OG,and foley in place.

## 2015-10-01 NOTE — Progress Notes (Signed)
Spoke with Dr. Sung AmabileSimonds about new ventilator orders he had written he stated for them not to be followed until patient was moved to the CCU.

## 2015-10-01 NOTE — Progress Notes (Signed)
*  PRELIMINARY RESULTS* Echocardiogram 2D Echocardiogram has been performed.  Jonathon Bryant 09/17/2015, 9:41 PM

## 2015-10-01 NOTE — Consult Note (Signed)
Pharmacy Antibiotic Note  Jonathon Bryant is a 50 y.o. male admitted on 09/08/2015 with sepsis/concern for SBP.  Pharmacy has been consulted for vancomycin and ceftriaxone dosing.  Plan: Patient received 1g of vancomycin in the ED. Will start 1250mg  q 12hr 6 hours from first dose for stacked dosing. Vancomycin 1250 IV every 12 hours.  Goal trough 15-20 mcg/mL.  Patient is obese and is at risk for accumulation.  Will check trough on 0430 @ 0530 Ceftriaxone 2g q 24 hours  Height: 5\' 10"  (177.8 cm) Weight: 287 lb 14.7 oz (130.6 kg) IBW/kg (Calculated) : 73  Temp (24hrs), Avg:98.7 F (37.1 C), Min:98.7 F (37.1 C), Max:98.7 F (37.1 C)   Recent Labs Lab 10/03/2015 1115  WBC 27.8*  CREATININE 1.44*  LATICACIDVEN 7.0*    Estimated Creatinine Clearance: 81.5 mL/min (by C-G formula based on Cr of 1.44).    No Known Allergies  Antimicrobials this admission: zosyn 4/28 >> one dose levofloxacin 4/28 >> one dose Vancomycin 4/28>> ceftriaxone 4/28>>  Dose adjustments this admission:   Microbiology results: 4/28 BCx: pending 4/28 Sputum: pending    Thank you for allowing pharmacy to be a part of this patient's care.  Olene FlossMelissa D Maccia, Pharm.D Clinical Pharmacist   09/30/2015 2:10 PM

## 2015-10-02 ENCOUNTER — Inpatient Hospital Stay: Payer: BLUE CROSS/BLUE SHIELD

## 2015-10-02 LAB — BLOOD CULTURE ID PANEL (REFLEXED)
Acinetobacter baumannii: NOT DETECTED
CANDIDA ALBICANS: NOT DETECTED
CANDIDA PARAPSILOSIS: NOT DETECTED
CANDIDA TROPICALIS: NOT DETECTED
Candida glabrata: NOT DETECTED
Candida krusei: NOT DETECTED
Carbapenem resistance: NOT DETECTED
ENTEROBACTER CLOACAE COMPLEX: NOT DETECTED
ENTEROCOCCUS SPECIES: NOT DETECTED
Enterobacteriaceae species: NOT DETECTED
Escherichia coli: NOT DETECTED
HAEMOPHILUS INFLUENZAE: NOT DETECTED
KLEBSIELLA PNEUMONIAE: NOT DETECTED
Klebsiella oxytoca: NOT DETECTED
LISTERIA MONOCYTOGENES: NOT DETECTED
Methicillin resistance: NOT DETECTED
Neisseria meningitidis: NOT DETECTED
PROTEUS SPECIES: NOT DETECTED
Pseudomonas aeruginosa: NOT DETECTED
SERRATIA MARCESCENS: NOT DETECTED
STAPHYLOCOCCUS AUREUS BCID: NOT DETECTED
STREPTOCOCCUS PNEUMONIAE: DETECTED — AB
STREPTOCOCCUS PYOGENES: NOT DETECTED
Staphylococcus species: NOT DETECTED
Streptococcus agalactiae: NOT DETECTED
Streptococcus species: DETECTED — AB
VANCOMYCIN RESISTANCE: NOT DETECTED

## 2015-10-02 LAB — HEPATIC FUNCTION PANEL
ALBUMIN: 2.6 g/dL — AB (ref 3.5–5.0)
ALT: 38 U/L (ref 17–63)
AST: 65 U/L — ABNORMAL HIGH (ref 15–41)
Alkaline Phosphatase: 39 U/L (ref 38–126)
BILIRUBIN TOTAL: 2.7 mg/dL — AB (ref 0.3–1.2)
Bilirubin, Direct: 1.4 mg/dL — ABNORMAL HIGH (ref 0.1–0.5)
Indirect Bilirubin: 1.3 mg/dL — ABNORMAL HIGH (ref 0.3–0.9)
TOTAL PROTEIN: 5.9 g/dL — AB (ref 6.5–8.1)

## 2015-10-02 LAB — CBC
HEMATOCRIT: 39 % — AB (ref 40.0–52.0)
Hemoglobin: 13.1 g/dL (ref 13.0–18.0)
MCH: 34.9 pg — ABNORMAL HIGH (ref 26.0–34.0)
MCHC: 33.5 g/dL (ref 32.0–36.0)
MCV: 104 fL — ABNORMAL HIGH (ref 80.0–100.0)
PLATELETS: 64 10*3/uL — AB (ref 150–440)
RBC: 3.75 MIL/uL — ABNORMAL LOW (ref 4.40–5.90)
RDW: 15.4 % — AB (ref 11.5–14.5)
WBC: 14.7 10*3/uL — AB (ref 3.8–10.6)

## 2015-10-02 LAB — BLOOD GAS, ARTERIAL
ACID-BASE DEFICIT: 0.2 mmol/L (ref 0.0–2.0)
BICARBONATE: 30.1 meq/L — AB (ref 21.0–28.0)
FIO2: 1
O2 SAT: 84.7 %
PATIENT TEMPERATURE: 37
PCO2 ART: 77 mmHg — AB (ref 32.0–48.0)
PEEP/CPAP: 10 cmH2O
PH ART: 7.2 — AB (ref 7.350–7.450)
PO2 ART: 61 mmHg — AB (ref 83.0–108.0)
PRESSURE CONTROL: 20 cmH2O
RATE: 16 resp/min

## 2015-10-02 LAB — LACTIC ACID, PLASMA: LACTIC ACID, VENOUS: 5.2 mmol/L — AB (ref 0.5–2.0)

## 2015-10-02 LAB — BASIC METABOLIC PANEL
Anion gap: 9 (ref 5–15)
BUN: 49 mg/dL — AB (ref 6–20)
CHLORIDE: 101 mmol/L (ref 101–111)
CO2: 26 mmol/L (ref 22–32)
CREATININE: 1.85 mg/dL — AB (ref 0.61–1.24)
Calcium: 7.6 mg/dL — ABNORMAL LOW (ref 8.9–10.3)
GFR calc non Af Amer: 40 mL/min — ABNORMAL LOW (ref >60)
GFR, EST AFRICAN AMERICAN: 47 mL/min — AB (ref >60)
GLUCOSE: 104 mg/dL — AB (ref 65–99)
Potassium: 4.2 mmol/L (ref 3.5–5.1)
SODIUM: 136 mmol/L (ref 135–145)

## 2015-10-02 LAB — TROPONIN I: TROPONIN I: 0.14 ng/mL — AB (ref <0.031)

## 2015-10-02 LAB — BRAIN NATRIURETIC PEPTIDE: B NATRIURETIC PEPTIDE 5: 339 pg/mL — AB (ref 0.0–100.0)

## 2015-10-02 LAB — GLUCOSE, CAPILLARY
GLUCOSE-CAPILLARY: 81 mg/dL (ref 65–99)
Glucose-Capillary: 121 mg/dL — ABNORMAL HIGH (ref 65–99)

## 2015-10-02 LAB — PROCALCITONIN: PROCALCITONIN: 23.35 ng/mL

## 2015-10-02 MED ORDER — METOPROLOL TARTRATE 25 MG/10 ML ORAL SUSPENSION: 25.0000 mg | Freq: Two times a day (BID) | ORAL | Status: DC

## 2015-10-02 MED ORDER — LEVALBUTEROL HCL 0.63 MG/3ML IN NEBU
0.6300 mg | INHALATION_SOLUTION | Freq: Four times a day (QID) | RESPIRATORY_TRACT | Status: DC
  Administered 2015-10-02 – 2015-10-05 (×12): 0.63 mg via RESPIRATORY_TRACT
  Filled 2015-10-02 (×12): qty 3

## 2015-10-02 MED ORDER — FOLIC ACID 1 MG PO TABS
1.0000 mg | ORAL_TABLET | Freq: Every day | ORAL | Status: DC
  Administered 2015-10-02 – 2015-10-07 (×5): 1 mg
  Filled 2015-10-02 (×6): qty 1

## 2015-10-02 MED ORDER — VITAL HIGH PROTEIN PO LIQD
1000.0000 mL | ORAL | Status: DC
  Administered 2015-10-02 – 2015-10-07 (×4): 1000 mL

## 2015-10-02 MED ORDER — IPRATROPIUM-ALBUTEROL 0.5-2.5 (3) MG/3ML IN SOLN
3.0000 mL | Freq: Once | RESPIRATORY_TRACT | Status: AC
  Administered 2015-10-02: 3 mL via RESPIRATORY_TRACT
  Filled 2015-10-02: qty 3

## 2015-10-02 MED ORDER — METOPROLOL TARTRATE 25 MG PO TABS
25.0000 mg | ORAL_TABLET | Freq: Two times a day (BID) | ORAL | Status: DC
  Administered 2015-10-02 – 2015-10-07 (×10): 25 mg
  Filled 2015-10-02 (×11): qty 1

## 2015-10-02 MED ORDER — FREE WATER
100.0000 mL | Freq: Three times a day (TID) | Status: DC
  Administered 2015-10-02 – 2015-10-08 (×17): 100 mL

## 2015-10-02 MED ORDER — VECURONIUM BROMIDE 10 MG IV SOLR
10.0000 mg | Freq: Once | INTRAVENOUS | Status: AC
  Filled 2015-10-02: qty 10

## 2015-10-02 MED ORDER — VECURONIUM BROMIDE 10 MG IV SOLR
10.0000 mg | Freq: Once | INTRAVENOUS | Status: AC
  Administered 2015-10-02: 10 mg via INTRAVENOUS

## 2015-10-02 MED ORDER — PANTOPRAZOLE SODIUM 40 MG PO PACK
40.0000 mg | PACK | Freq: Every day | ORAL | Status: DC
  Administered 2015-10-02 – 2015-10-07 (×5): 40 mg
  Filled 2015-10-02 (×6): qty 20

## 2015-10-02 MED ORDER — LEVALBUTEROL HCL 0.63 MG/3ML IN NEBU: 0.6300 mg | INHALATION_SOLUTION | RESPIRATORY_TRACT | Status: DC | PRN

## 2015-10-02 MED ORDER — PRO-STAT SUGAR FREE PO LIQD
30.0000 mL | Freq: Three times a day (TID) | ORAL | Status: DC
  Administered 2015-10-02 – 2015-10-07 (×15): 30 mL

## 2015-10-02 MED ORDER — FENTANYL BOLUS VIA INFUSION
50.0000 ug | INTRAVENOUS | Status: DC | PRN
  Administered 2015-10-03: 50 ug via INTRAVENOUS
  Filled 2015-10-02: qty 100

## 2015-10-02 MED ORDER — ALBUTEROL SULFATE (2.5 MG/3ML) 0.083% IN NEBU: 2.5000 mg | INHALATION_SOLUTION | RESPIRATORY_TRACT | Status: DC | PRN

## 2015-10-02 NOTE — Progress Notes (Signed)
PULMONARY / CRITICAL CARE MEDICINE   Name: Jonathon Bryant MRN: [id number] DOB: [birth year]    ADMISSION DATE:  10/25/2015   PT PROFILE:   73 M with Htn, NASH admitted with fever, acidosis, AMS, severe sepsis, presumed RLL PNA. Intubated in ED. Developed AFRVR in ED  MAJOR EVENTS/TEST RESULTS: 04/28 Admitted with diagnoses of severe sepsis, acute respiratory failure, suspected PNA, h/o NASH. Developed AFRVR in ED and suffered NSVT while CVL being placed 04/28 Abdominal US: No ascites seen 04/28 TTE:  04/29 BC positive for pneumococcus 04/29 refractory hypoxemia - vent changed to PC mode  INDWELLING DEVICES:: ETT 04/28 >>  R IJ CVL 04/28 >>   MICRO DATA: MRSA PCR 04/28 >> NEG Urine 04/28 >> UA negative Resp  04/28 >>  Blood 04/28 >> 2/2 pneumococcus  PCT 04/28 - 04/30: 19.05, 23.35,   ANTIMICROBIALS:  Vanc 04/28 >> 04/29 Levofloxacin 04/28 >>   SUBJECTIVE:   Persistent and refractory hypoxemia that does not seem to be PEEP responsive. Oxygenates better in L lateral decub position. RASS -4, not F/C. No WUA performed due to high level of support required  VITAL SIGNS: BP 127/78 mmHg  Pulse 120  Temp(Src) 98.6 F (37 C) (Other (Comment))  Resp 19  Ht  (1.778 m)  Wt 298 lb 8.1 oz (135.4 kg)  BMI 42.83 kg/m2  SpO2 88%  HEMODYNAMICS:    VENTILATOR SETTINGS: Vent Mode:  [-] PCV FiO2 (%):  [100 %] 100 % Set Rate:  [16 bmp-24 bmp] 16 bmp Vt Set:  [500 mL] 500 mL PEEP:  [5 cmH20-12 cmH20] 12 cmH20  INTAKE / OUTPUT: I/O last 3 completed shifts: In: 2823.1 [I.V.:2573.1; IV Piggyback:250] Out: 550 [Urine:550]  PHYSICAL EXAMINATION: General: Obese, RASS -4, intubated and sedated/NMB Neuro: Limited exam due to NMB, PERRL HEENT: NCAT Cardiovascular: tachy, IRIR, no M Lungs: Bilateral airflow, +rhonchi in anterior lung fields, no wheezes Abdomen: obese, soft, ND, diminished BS Ext: warm, no edema Skin: limited exam, no lesions noted  LABS:  BMET  Recent  Labs Lab 2015/10/25 1115 10/02/15 0505  NA 132* 136  K 4.6 4.2  CL 99* 101  CO2 17* 26  BUN 33* 49*  CREATININE 1.44* 1.85*  GLUCOSE 90 104*    Electrolytes  Recent Labs Lab 10-25-15 1115 10/02/15 0505  CALCIUM 8.7* 7.6*    CBC  Recent Labs Lab 10-25-15 1115 10/02/15 0505  WBC 27.8* 14.7*  HGB 14.1 13.1  HCT 42.9 39.0*  PLT 63* 64*    Coag's  Recent Labs Lab 2015/10/25 1115  APTT 29  INR 1.68    Sepsis Markers  Recent Labs Lab 10-25-2015 1115 25-Oct-2015 1625 25-Oct-2015 1950 25-Oct-2015 2332  LATICACIDVEN 7.0* 7.6* 7.3* 5.2*  PROCALCITON 19.05  --   --   --     ABG  Recent Labs Lab 10/25/2015 1629 Oct 25, 2015 2002 10-25-15 2211  PHART 7.17* 7.29* 7.31*  PCO2ART 55* 36 44  PO2ART 73* 59* 64*    Liver Enzymes  Recent Labs Lab Oct 25, 2015 1115 10/02/15 0505  AST 91* 65*  ALT 46 38  ALKPHOS 63 39  BILITOT 6.0* 2.7*  ALBUMIN 3.3* 2.6*    Cardiac Enzymes  Recent Labs Lab 2015/10/25 1115 10/02/15 0505  TROPONINI 0.05* 0.14*    Glucose  Recent Labs Lab 10/25/2015 1535 10/02/15 0026  GLUCAP 123* 81    CXR: RLL consolidation  ASSESSMENT / PLAN:  PULMONARY A:  Acute hypoxic respiratory failure Refractory hypoxemia RLL CAP - pneumococcus Smoker without  h/o COPD Respiratory acidosis - would not try to normalize this due ot risk of VILI P:   Cont full vent support - settings reviewed and/or adjusted Cont vent bundle Daily SBT if/when meets criteria  CARDIOVASCULAR A:  New onset AFRVR - responds well to metoprolol Transient septic shock - appears resolved H/O hypertension P:  Monitor rhythm and BP DC amiodarone due to high FiO2 requirements and potentiation of oxygen lung toxicity TTE reslts pending  RENAL A:   AKI, nonoliguric Lactic acidosis - mild, due to severe sepsis P:   Monitor BMET intermittently Monitor I/Os Correct electrolytes as indicated Avoid nephrotoxic medications  GASTROINTESTINAL A:   Obesity NASH P:    SUP: enteral PPI TF protocol initiated 04/28  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 P:  DVT px: SCDs Monitor CBC intermittently Transfuse per usual guidelines  INFECTIOUS A:   Severe sepsis Suspected RLL CAP P:   Monitor temp, WBC count Micro and abx as above Trend lactic acid and PCT  ENDOCRINE A:   No issues   P:   Monitor CBGs Consider SSI if glu > 180  NEUROLOGIC A:   Acute encephalopathy Hyperammonemia  P:   RASS goal: -1, -2 PAD protocol 04/28 - propofol-4/28, PRN fentanyl, PRN midaz and fentanyl; gtt 04/28 Trend ammonia level Lactulose   FAMILY  Wife updated @ bedside  CCM time: 60 mins The above time includes time spent in consultation with patient and/or family members and reviewing care plan on multidisciplinary rounds  Billy Fischeravid Lamarkus Nebel, MD PCCM service Mobile [phone] Pager [phone]     10/02/2015, 6:38 AM

## 2015-10-02 NOTE — Progress Notes (Signed)
Initial Nutrition Assessment  DOCUMENTATION CODES:   Morbid obesity  INTERVENTION:  -Recommend starting TF at rate of 20 ml/hr as per Adult Tube Feeding Protocol; recommend new goal rate of 60 ml/hr with Prostat TID providing 1740 kcals, 172 g of protein, 1210 mL of free water. Meets 100% estimated calorie and protein needs as per ASPEN guidelines.  Additional free water flushes of 100 mL q 8 hours ordered per MD. Continue to assess   NUTRITION DIAGNOSIS:   Inadequate oral intake related to acute illness as evidenced by NPO status, per patient/family report.  GOAL:   Provide needs based on ASPEN/SCCM guidelines  MONITOR:   TF tolerance, Vent status, Labs, I & O's, Weight trends  REASON FOR ASSESSMENT:   Ventilator, Consult Enteral/tube feeding initiation and management  ASSESSMENT:   [age] yo male admitted with severe sepsis, acute respiratory failure with suspected pneumonia, hx of NASH. Pt with respiratory distressed on admission, initially placed on BiPap but required intubation 09/05/2015   US abdomen with no ascites present  Patient is currently intubated on ventilator support MV: 14 L/min Temp (24hrs), Avg:98.2 F (36.8 C), Min:97.7 F (36.5 C), Max:98.8 F (37.1 C)  Adult Tube Feeding Protocol ordered per MD on admission yesterday; not started. Per Madelaine BhatAdam RN, pt not stable enough for initiation of TF yesterday but plans to start this AM. Wife at bedside and reports pt appetite has been down for the past month or so; eating a few times per day but not as much as usual  Past Medical History  Diagnosis Date  . Hypertension   . Cirrhosis (HCC)     Diet Order:   NPO  Skin:  Reviewed, no issues  Last BM:  no BM documented   Digestive System: pt with OG tube located in stomach, OG with 450 mL out, abdomen distended/obese but soft on exam (pt with hx of ascites), BS active  Labs: Creatinine 1.85, BUN 49, corrected calcium 8.7 (albumin 2.6)  Meds: lactulose, fentanyl,  versed  Nutrition-Focused physical exam completed. Findings are WDL for fat depletion, muscle depletion, and edema.   Height:   Ht Readings from Last 1 Encounters:  09/13/2015 5\' 10"  (1.778 m)    Weight: wife reports wt flucuates due to fluid status (ascites and LE edema) but overall pt might be down a pound or two  Wt Readings from Last 1 Encounters:  10/02/15 298 lb 8.1 oz (135.4 kg)    Ideal Body Weight:     BMI:  Body mass index is 42.83 kg/(m^2).  Estimated Nutritional Needs:   Kcal:  1485-1890 kcals  (11-14 kcals/kg using weight of 135.4 kg per ASPEN guidelines for BMI >30)  Protein:  >/= 151 g (2.0 g/kg)   Fluid:  >/= 1.5 L  EDUCATION NEEDS:   No education needs identified at this time  Romelle StarcherCate Nashanti Duquette MS, RD, LDN [phone] Pager  [phone] Weekend/On-Call Pager

## 2015-10-02 NOTE — Progress Notes (Signed)
Pharmacy Antibiotic Follow-up Note  Jonathon MusicStephen E Bryant is a 50 y.o. year-old male admitted on 10/02/2015.  The patient is currently on day 1 of vancomycin and levofloxacin for PNA.  Assessment/Plan: Lab called to report GPC strep species, strep pneumo, in 2 bottles of 2 sets. Patient is currently on levofloxacin and vancomycin, being treated for PNA/severe sepsis. Spoke with Dr. Anne HahnWillis, no change at this point.   Temp (24hrs), Avg:98.2 F (36.8 C), Min:97.7 F (36.5 C), Max:98.7 F (37.1 C)   Recent Labs Lab 10/02/2015 1115  WBC 27.8*    Recent Labs Lab 09/04/2015 1115  CREATININE 1.44*   Estimated Creatinine Clearance: 81.5 mL/min (by C-G formula based on Cr of 1.44).    No Known Allergies   Thank you for allowing pharmacy to be a part of this patient's care.  Carola FrostNathan A Jawuan Robb, Pharm.D., BCPS Clinical Pharmacist 10/02/2015 2:01 AM

## 2015-10-02 NOTE — Progress Notes (Signed)
Pt remains sedated on Vent. HR continues to stay elevated and SpO2 remains in 80's after turning Pt to change sheets. Troponin .14 this AM. NP on unit made aware of Pt's condition. Awaiting new orders.

## 2015-10-03 ENCOUNTER — Inpatient Hospital Stay: Payer: BLUE CROSS/BLUE SHIELD

## 2015-10-03 DIAGNOSIS — J13 Pneumonia due to Streptococcus pneumoniae: Secondary | ICD-10-CM

## 2015-10-03 LAB — COMPREHENSIVE METABOLIC PANEL
ALT: 44 U/L (ref 17–63)
AST: 113 U/L — AB (ref 15–41)
Albumin: 2.5 g/dL — ABNORMAL LOW (ref 3.5–5.0)
Alkaline Phosphatase: 61 U/L (ref 38–126)
Anion gap: 9 (ref 5–15)
BILIRUBIN TOTAL: 2.5 mg/dL — AB (ref 0.3–1.2)
BUN: 75 mg/dL — AB (ref 6–20)
CHLORIDE: 102 mmol/L (ref 101–111)
CO2: 28 mmol/L (ref 22–32)
Calcium: 7.7 mg/dL — ABNORMAL LOW (ref 8.9–10.3)
Creatinine, Ser: 1.88 mg/dL — ABNORMAL HIGH (ref 0.61–1.24)
GFR, EST AFRICAN AMERICAN: 46 mL/min — AB (ref >60)
GFR, EST NON AFRICAN AMERICAN: 39 mL/min — AB (ref >60)
Glucose, Bld: 136 mg/dL — ABNORMAL HIGH (ref 65–99)
POTASSIUM: 4.4 mmol/L (ref 3.5–5.1)
Sodium: 139 mmol/L (ref 135–145)
Total Protein: 5.9 g/dL — ABNORMAL LOW (ref 6.5–8.1)

## 2015-10-03 LAB — VITAMIN B12: VITAMIN B 12: 1211 pg/mL — AB (ref 180–914)

## 2015-10-03 LAB — CBC
HCT: 39 % — ABNORMAL LOW (ref 40.0–52.0)
Hemoglobin: 13 g/dL (ref 13.0–18.0)
MCH: 34.4 pg — ABNORMAL HIGH (ref 26.0–34.0)
MCHC: 33.3 g/dL (ref 32.0–36.0)
MCV: 103.3 fL — AB (ref 80.0–100.0)
PLATELETS: 84 10*3/uL — AB (ref 150–440)
RBC: 3.78 MIL/uL — ABNORMAL LOW (ref 4.40–5.90)
RDW: 15.3 % — AB (ref 11.5–14.5)
WBC: 16.3 10*3/uL — AB (ref 3.8–10.6)

## 2015-10-03 LAB — PROCALCITONIN: Procalcitonin: 17.25 ng/mL

## 2015-10-03 LAB — TROPONIN I: TROPONIN I: 0.12 ng/mL — AB (ref <0.031)

## 2015-10-03 MED ORDER — ASPIRIN 81 MG PO CHEW
81.0000 mg | CHEWABLE_TABLET | Freq: Every day | ORAL | Status: DC
  Administered 2015-10-04 – 2015-10-07 (×4): 81 mg
  Filled 2015-10-03 (×5): qty 1

## 2015-10-03 MED ORDER — ENOXAPARIN SODIUM 150 MG/ML ~~LOC~~ SOLN
1.0000 mg/kg | Freq: Two times a day (BID) | SUBCUTANEOUS | Status: DC
  Administered 2015-10-03 – 2015-10-07 (×7): 130 mg via SUBCUTANEOUS
  Filled 2015-10-03 (×8): qty 1

## 2015-10-03 MED ORDER — ACETAMINOPHEN 325 MG PO TABS
650.0000 mg | ORAL_TABLET | Freq: Four times a day (QID) | ORAL | Status: DC | PRN
  Administered 2015-10-03 – 2015-10-06 (×7): 650 mg via ORAL
  Filled 2015-10-03 (×7): qty 2

## 2015-10-03 MED FILL — Medication: Qty: 1 | Status: AC

## 2015-10-03 NOTE — Progress Notes (Signed)
ANTICOAGULATION CONSULT NOTE - Initial Consult  Pharmacy Consult for Lovenox Indication: atrial fibrillation  No Known Allergies  Patient Measurements: Height: 5' 10"  (177.8 cm) Weight: 290 lb 5.5 oz (131.7 kg) IBW/kg (Calculated) : 73   Vital Signs: Temp: 100.2 F (37.9 C) (04/30 0900) BP: 106/56 mmHg (04/30 0900) Pulse Rate: 109 (04/30 0900)  Labs:  Recent Labs  09/23/2015 1115 10/02/15 0505 10/03/15 0425  HGB 14.1 13.1 13.0  HCT 42.9 39.0* 39.0*  PLT 63* 64* 84*  APTT 29  --   --   LABPROT 19.8*  --   --   INR 1.68  --   --   CREATININE 1.44* 1.85* 1.88*  TROPONINI 0.05* 0.14* 0.12*    Estimated Creatinine Clearance: 62.7 mL/min (by C-G formula based on Cr of 1.88).   Medical History: Past Medical History  Diagnosis Date  . Hypertension   . Cirrhosis ([place])     Medications:  Scheduled:  . antiseptic oral rinse  7 mL Mouth Rinse QID  . aspirin  81 mg Per Tube Daily  . chlorhexidine gluconate (SAGE KIT)  15 mL Mouth Rinse BID  . enoxaparin (LOVENOX) injection  1 mg/kg Subcutaneous Q12H  . feeding supplement (PRO-STAT SUGAR FREE 64)  30 mL Per Tube TID  . folic acid  1 mg Per Tube Daily  . free water  100 mL Per Tube Q8H  . lactulose  30 g Per Tube BID  . levalbuterol  0.63 mg Nebulization Q6H  . levofloxacin (LEVAQUIN) IV  750 mg Intravenous Q24H  . metoprolol tartrate  25 mg Per Tube BID  . pantoprazole sodium  40 mg Per Tube Q1200   Infusions:  . feeding supplement (VITAL HIGH PROTEIN) 1,000 mL (10/03/15 0400)  . fentaNYL infusion INTRAVENOUS 175 mcg/hr (10/03/15 0809)    Assessment: Pharmacy consulted to dose Lovenox in a 50 yo male with atrial fibrillation.   Est CrCl~62.7 mL/min, Plts: 84  Goal of Therapy:  Monitor SCr and CBC per policy  Plan:  Will start patient on Lovenox 1 mg/kg subq q12h (130 mg subq q12h) based on renal function.   Pharmacy will continue to follow.  Kristl Morioka G 10/03/2015,10:57 AM

## 2015-10-03 NOTE — Progress Notes (Signed)
PULMONARY / CRITICAL CARE MEDICINE   Name: Jonathon Bryant MRN: [id number] DOB: 12/08/1965    ADMISSION DATE:  05/28/2016   PT PROFILE:   [age] M with Htn, NASH admitted with fever, acidosis, AMS, severe sepsis, presumed RLL PNA. Intubated in ED. Developed AFRVR in ED  MAJOR EVENTS/TEST RESULTS: 04/28 Admitted with diagnoses of severe sepsis, acute respiratory failure, suspected PNA, h/o NASH. Developed AFRVR in ED and suffered NSVT while CVL being placed 04/28 Abdominal US: No ascites seen 04/28 TTE: LVEF 50-55%. LA mildly dilated. Normal PA pressures 04/29 BC positive for pneumococcus 04/29 refractory hypoxemia - vent changed to PC mode 04/30 improved oxygenation. AF persists. Full dose enoxaparin initiated  INDWELLING DEVICES:: ETT 04/28 >>  R IJ CVL 04/28 >>   MICRO DATA: MRSA PCR 04/28 >> NEG Urine 04/28 >> UA negative Resp  04/28 >>  Blood 04/28 >> 2/2 pneumococcus  PCT 04/28 - 04/30: 19.05, 23.35, 17.25  ANTIMICROBIALS:  Vanc 04/28 >> 04/29 Levofloxacin 04/28 >>   SUBJECTIVE:   No WUA due to high FiO2RASS -4, not F/C.  VITAL SIGNS: BP 106/56 mmHg  Pulse 109  Temp(Src) 100.2 F (37.9 C) (Other (Comment))  Resp 16  Ht 5\' 10"  (1.778 m)  Wt 131.7 kg (290 lb 5.5 oz)  BMI 41.66 kg/m2  SpO2 96%  HEMODYNAMICS:    VENTILATOR SETTINGS: Vent Mode:  [-] PCV FiO2 (%):  [80 %-100 %] 80 % Set Rate:  [16 bmp] 16 bmp PEEP:  [8 cmH20] 8 cmH20  INTAKE / OUTPUT: I/O last 3 completed shifts: In: 3350.7 [I.V.:2215; NG/GT:885.7; IV Piggyback:250] Out: 2475 [Urine:2025; Emesis/NG output:450]  PHYSICAL EXAMINATION: General: Obese, RASS -4, intubated and sedated/NMB Neuro: Limited exam due to NMB, PERRL HEENT: NCAT Cardiovascular: tachy, IRIR, no M Lungs: Bilateral airflow, dependent R basilar crackles and rhonchi, no wheezes Abdomen: obese, soft, ND, diminished BS Ext: warm, no edema Skin: limited exam, no lesions noted  LABS:  BMET  Recent Labs Lab  08-14-15 1115 10/02/15 0505 10/03/15 0425  NA 132* 136 139  K 4.6 4.2 4.4  CL 99* 101 102  CO2 17* 26 28  BUN 33* 49* 75*  CREATININE 1.44* 1.85* 1.88*  GLUCOSE 90 104* 136*    Electrolytes  Recent Labs Lab 08-14-15 1115 10/02/15 0505 10/03/15 0425  CALCIUM 8.7* 7.6* 7.7*    CBC  Recent Labs Lab 08-14-15 1115 10/02/15 0505 10/03/15 0425  WBC 27.8* 14.7* 16.3*  HGB 14.1 13.1 13.0  HCT 42.9 39.0* 39.0*  PLT 63* 64* 84*    Coag's  Recent Labs Lab 08-14-15 1115  APTT 29  INR 1.68    Sepsis Markers  Recent Labs Lab 08-14-15 1115 08-14-15 1625 08-14-15 1950 08-14-15 2332 10/02/15 0505 10/03/15 0425  LATICACIDVEN 7.0* 7.6* 7.3* 5.2*  --   --   PROCALCITON 19.05  --   --   --  23.35 17.25    ABG  Recent Labs Lab 08-14-15 2002 08-14-15 2211 10/02/15 0646  PHART 7.29* 7.31* 7.20*  PCO2ART 36 44 77*  PO2ART 59* 64* 61*    Liver Enzymes  Recent Labs Lab 08-14-15 1115 10/02/15 0505 10/03/15 0425  AST 91* 65* 113*  ALT 46 38 44  ALKPHOS 63 39 61  BILITOT 6.0* 2.7* 2.5*  ALBUMIN 3.3* 2.6* 2.5*    Cardiac Enzymes  Recent Labs Lab 08-14-15 1115 10/02/15 0505 10/03/15 0425  TROPONINI 0.05* 0.14* 0.12*    Glucose  Recent Labs Lab 08-14-15 1535 10/02/15 0026 10/02/15 2339  GLUCAP 123* 81 121*    CXR: NSC dense RLL consolidation. Possible R pleural effusion  ASSESSMENT / PLAN:  PULMONARY A:  Acute hypoxic respiratory failure Severe hypoxemia RLL CAP - pneumococcal Smoker without h/o COPD Possible para-pneumoic effusion P:   Cont full vent support - settings reviewed and/or adjusted Cont vent bundle Daily SBT if/when meets criteria  CARDIOVASCULAR A:  New onset AFRVR - controlled with metoprolol Transient septic shock, resolved H/O hypertension P:  Monitor rhythm and BP Avoid amiodarone due to high FiO2 requirements and risk of oxidative lung injury Begin full dose LMWH 04/30  RENAL A:   AKI,  nonoliguric Lactic acidosis - mild, due to severe sepsis P:   Monitor BMET intermittently Monitor I/Os Correct electrolytes as indicated Avoid nephrotoxic medications  GASTROINTESTINAL A:   Obesity NASH P:   SUP: enteral PPI TF protocol initiated 04/28 Monitor LFTs intermittently  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 P:  DVT px: full dose LMWH for AF Monitor CBC intermittently Transfuse per usual guidelines Monitor plt count closely while on LMWH  INFECTIOUS A:   Severe sepsis RLL pneumococcal PNA Possible R para-pneumonic effusion  Minimal fluid seen on [place] of chest 04/30 P:   Monitor temp, WBC count Micro and abx as above Recheck PCT and WBC count 05/01 - if not resolving to normal as expected, consider CT chest to assess for loculated effusion  ENDOCRINE A:   No issues   P:   Monitor CBGs Consider SSI if glu > 180  NEUROLOGIC A:   Acute encephalopathy Hyperammonemia  P:   RASS goal: -1, -2 PAD protocol 04/28 - propofol-4/28, PRN fentanyl, PRN midaz and fentanyl; gtt 04/28 Cont Lactulose   FAMILY  Wife updated @ bedside  CCM time: 35 mins The above time includes time spent in consultation with patient and/or family members and reviewing care plan on multidisciplinary rounds  Billy Fischer, MD PCCM service Mobile [phone] Pager [phone]     10/03/2015, 3:01 PM

## 2015-10-04 ENCOUNTER — Inpatient Hospital Stay: Payer: BLUE CROSS/BLUE SHIELD

## 2015-10-04 DIAGNOSIS — J9600 Acute respiratory failure, unspecified whether with hypoxia or hypercapnia: Secondary | ICD-10-CM

## 2015-10-04 LAB — COMPREHENSIVE METABOLIC PANEL WITH GFR
ALT: 47 U/L (ref 17–63)
AST: 133 U/L — ABNORMAL HIGH (ref 15–41)
Albumin: 2.3 g/dL — ABNORMAL LOW (ref 3.5–5.0)
Alkaline Phosphatase: 80 U/L (ref 38–126)
Anion gap: 7 (ref 5–15)
BUN: 82 mg/dL — ABNORMAL HIGH (ref 6–20)
CO2: 31 mmol/L (ref 22–32)
Calcium: 8.3 mg/dL — ABNORMAL LOW (ref 8.9–10.3)
Chloride: 106 mmol/L (ref 101–111)
Creatinine, Ser: 1.82 mg/dL — ABNORMAL HIGH (ref 0.61–1.24)
GFR calc Af Amer: 48 mL/min — ABNORMAL LOW
GFR calc non Af Amer: 41 mL/min — ABNORMAL LOW
Glucose, Bld: 131 mg/dL — ABNORMAL HIGH (ref 65–99)
Potassium: 4.4 mmol/L (ref 3.5–5.1)
Sodium: 144 mmol/L (ref 135–145)
Total Bilirubin: 2.9 mg/dL — ABNORMAL HIGH (ref 0.3–1.2)
Total Protein: 5.8 g/dL — ABNORMAL LOW (ref 6.5–8.1)

## 2015-10-04 LAB — CBC
HCT: 37.9 % — ABNORMAL LOW (ref 40.0–52.0)
Hemoglobin: 12.9 g/dL — ABNORMAL LOW (ref 13.0–18.0)
MCH: 34.6 pg — AB (ref 26.0–34.0)
MCHC: 33.9 g/dL (ref 32.0–36.0)
MCV: 102.1 fL — ABNORMAL HIGH (ref 80.0–100.0)
PLATELETS: 89 10*3/uL — AB (ref 150–440)
RBC: 3.72 MIL/uL — AB (ref 4.40–5.90)
RDW: 15.5 % — ABNORMAL HIGH (ref 11.5–14.5)
WBC: 16.2 10*3/uL — ABNORMAL HIGH (ref 3.8–10.6)

## 2015-10-04 LAB — CULTURE, RESPIRATORY: CULTURE: NO GROWTH

## 2015-10-04 LAB — CULTURE, BLOOD (ROUTINE X 2)

## 2015-10-04 LAB — GLUCOSE, CAPILLARY
Glucose-Capillary: 126 mg/dL — ABNORMAL HIGH (ref 65–99)
Glucose-Capillary: 132 mg/dL — ABNORMAL HIGH (ref 65–99)

## 2015-10-04 LAB — PROCALCITONIN: Procalcitonin: 12.03 ng/mL

## 2015-10-04 LAB — CULTURE, RESPIRATORY W GRAM STAIN

## 2015-10-04 MED ORDER — IBUPROFEN 100 MG/5ML PO SUSP
400.0000 mg | Freq: Once | ORAL | Status: AC
  Administered 2015-10-04: 400 mg
  Filled 2015-10-04: qty 20

## 2015-10-04 MED ORDER — IBUPROFEN 100 MG/5ML PO SUSP
800.0000 mg | Freq: Three times a day (TID) | ORAL | Status: DC | PRN
  Administered 2015-10-04 – 2015-10-06 (×5): 800 mg
  Filled 2015-10-04 (×6): qty 40

## 2015-10-04 MED ORDER — PENICILLIN G POTASSIUM 5000000 UNITS IJ SOLR
4.0000 10*6.[IU] | INTRAVENOUS | Status: DC
  Administered 2015-10-04 – 2015-10-07 (×19): 4 10*6.[IU] via INTRAVENOUS
  Filled 2015-10-04 (×26): qty 4

## 2015-10-04 MED ORDER — SODIUM CHLORIDE 0.9% FLUSH
10.0000 mL | Freq: Two times a day (BID) | INTRAVENOUS | Status: DC
  Administered 2015-10-04 – 2015-10-09 (×9): 10 mL

## 2015-10-04 MED ORDER — LACTULOSE 10 GM/15ML PO SOLN
30.0000 g | Freq: Three times a day (TID) | ORAL | Status: DC
  Administered 2015-10-04 – 2015-10-08 (×12): 30 g
  Filled 2015-10-04 (×13): qty 60

## 2015-10-04 NOTE — Progress Notes (Signed)
ANTICOAGULATION CONSULT NOTE - Initial Consult  Pharmacy Consult for Lovenox Indication: atrial fibrillation  No Known Allergies  Patient Measurements: Height: 5' 10"  (177.8 cm) Weight: 287 lb 14.7 oz (130.6 kg) IBW/kg (Calculated) : 73   Vital Signs: Temp: 101.1 F (38.4 C) (05/01 1200) BP: 112/60 mmHg (05/01 1200) Pulse Rate: 118 (05/01 1200)  Labs:  Recent Labs  10/02/15 0505 10/03/15 0425 10/04/15 0548  HGB 13.1 13.0 12.9*  HCT 39.0* 39.0* 37.9*  PLT 64* 84* 89*  CREATININE 1.85* 1.88* 1.82*  TROPONINI 0.14* 0.12*  --     Estimated Creatinine Clearance: 64.5 mL/min (by C-G formula based on Cr of 1.82).   Medical History: Past Medical History  Diagnosis Date  . Hypertension   . Cirrhosis ([place])     Medications:  Scheduled:  . antiseptic oral rinse  7 mL Mouth Rinse QID  . aspirin  81 mg Per Tube Daily  . chlorhexidine gluconate (SAGE KIT)  15 mL Mouth Rinse BID  . enoxaparin (LOVENOX) injection  1 mg/kg Subcutaneous Q12H  . feeding supplement (PRO-STAT SUGAR FREE 64)  30 mL Per Tube TID  . folic acid  1 mg Per Tube Daily  . free water  100 mL Per Tube Q8H  . lactulose  30 g Per Tube TID  . levalbuterol  0.63 mg Nebulization Q6H  . metoprolol tartrate  25 mg Per Tube BID  . pantoprazole sodium  40 mg Per Tube Q1200  . pencillin G potassium IV  4 Million Units Intravenous Q4H   Infusions:  . feeding supplement (VITAL HIGH PROTEIN) 1,000 mL (10/03/15 0400)  . fentaNYL infusion INTRAVENOUS 175 mcg/hr (10/04/15 1149)    Assessment: Pharmacy consulted to dose Lovenox in a 50 yo male with atrial fibrillation.   Goal of Therapy:  Monitor SCr and CBC per policy  Plan:  Will continue patient on Lovenox 1 mg/kg subq q12h (130 mg subq q12h) based on renal function.   Pharmacy will continue to follow.  Ulice Dash D 10/04/2015,2:08 PM

## 2015-10-04 NOTE — Progress Notes (Signed)
PULMONARY / CRITICAL CARE MEDICINE   Name: Jonathon MusicStephen E Tennant MRN: [id number] DOB: 03/08/1966    ADMISSION DATE:  November 04, 2015   PT PROFILE:   [age] M with Htn, NASH admitted with fever, acidosis, AMS, severe sepsis, presumed RLL PNA. Intubated in ED. Developed AFRVR in ED  Subjective Still requiring high FiO2 and febrile. Tylenol and motrin give. Tolerated a bath without dropping O2 saturation. No other issues overnight  MAJOR EVENTS/TEST RESULTS: 04/28 Admitted with diagnoses of severe sepsis, acute respiratory failure, suspected PNA, h/o NASH. Developed AFRVR in ED and suffered NSVT while CVL being placed 04/28 Abdominal US: No ascites seen 04/28 TTE: LVEF 50-55%. LA mildly dilated. Normal PA pressures 04/29 BC positive for pneumococcus 04/29 refractory hypoxemia - vent changed to PC mode 04/30 improved oxygenation. AF persists. Full dose enoxaparin initiated  INDWELLING DEVICES:: ETT 04/28 >>  R IJ CVL 04/28 >>   MICRO DATA: MRSA PCR 04/28 >> NEG Urine 04/28 >> UA negative Resp  04/28 >>  Blood 04/28 >> 2/2 pneumococcus  PCT 04/28 - 04/30: 19.05, 23.35, 17.25  ANTIMICROBIALS:  Vanc 04/28 >> 04/29 Levofloxacin 04/28 >>   SUBJECTIVE:   No WUA due to high FiO2RASS -4, not F/C.  VITAL SIGNS: BP 109/68 mmHg  Pulse 108  Temp(Src) 101.1 F (38.4 C) (Other (Comment))  Resp 15  Ht 5\' 10"  (1.778 m)  Wt 290 lb 5.5 oz (131.7 kg)  BMI 41.66 kg/m2  SpO2 97%  HEMODYNAMICS:    VENTILATOR SETTINGS: Vent Mode:  [-] PCV FiO2 (%):  [80 %] 80 % Set Rate:  [16 bmp] 16 bmp PEEP:  [8 cmH20] 8 cmH20  INTAKE / OUTPUT: I/O last 3 completed shifts: In: 2390.1 [I.V.:724.4; NG/GT:1665.7] Out: 2500 [Urine:2500]  PHYSICAL EXAMINATION: General: Obese, RASS -4, intubated and sedated/NMB Neuro: Limited exam due to NMB HEENT: PERRLA, mild scleral edema, ETT, oral mucosa pink Cardiovascular: IRIR in the low 100s, no MRG  Lungs: Bilateral airflow, dependent R basilar crackles and rhonchi,  mild expiratory wheezes in right upper lung fields Abdomen: obese, soft, ND, diminished BS Ext: warm, no edema Skin: limited exam, no lesions noted  LABS:  BMET  Recent Labs Lab 2015-09-29 1115 10/02/15 0505 10/03/15 0425  NA 132* 136 139  K 4.6 4.2 4.4  CL 99* 101 102  CO2 17* 26 28  BUN 33* 49* 75*  CREATININE 1.44* 1.85* 1.88*  GLUCOSE 90 104* 136*    Electrolytes  Recent Labs Lab 2015-09-29 1115 10/02/15 0505 10/03/15 0425  CALCIUM 8.7* 7.6* 7.7*    CBC  Recent Labs Lab 2015-09-29 1115 10/02/15 0505 10/03/15 0425  WBC 27.8* 14.7* 16.3*  HGB 14.1 13.1 13.0  HCT 42.9 39.0* 39.0*  PLT 63* 64* 84*    Coag's  Recent Labs Lab 2015-09-29 1115  APTT 29  INR 1.68    Sepsis Markers  Recent Labs Lab 2015-09-29 1115 2015-09-29 1625 2015-09-29 1950 2015-09-29 2332 10/02/15 0505 10/03/15 0425  LATICACIDVEN 7.0* 7.6* 7.3* 5.2*  --   --   PROCALCITON 19.05  --   --   --  23.35 17.25    ABG  Recent Labs Lab 2015-09-29 2002 2015-09-29 2211 10/02/15 0646  PHART 7.29* 7.31* 7.20*  PCO2ART 36 44 77*  PO2ART 59* 64* 61*    Liver Enzymes  Recent Labs Lab 2015-09-29 1115 10/02/15 0505 10/03/15 0425  AST 91* 65* 113*  ALT 46 38 44  ALKPHOS 63 39 61  BILITOT 6.0* 2.7* 2.5*  ALBUMIN 3.3* 2.6* 2.5*  Cardiac Enzymes  Recent Labs Lab 09/17/2015 1115 10/02/15 0505 10/03/15 0425  TROPONINI 0.05* 0.14* 0.12*    Glucose  Recent Labs Lab 09/11/2015 1535 10/02/15 0026 10/02/15 2339 10/04/15 0029  GLUCAP 123* 81 121* 132*    Dg Chest Port 1 View  10/03/2015  CLINICAL DATA:  Respiratory failure. History of hypertension, cirrhosis, daily smoking. EXAM: PORTABLE CHEST 1 VIEW COMPARISON:  Chest x-rays dated 10/02/2015 and 09/13/2015. FINDINGS: Endotracheal tube remains well positioned with tip just above the level of the carina. Right IJ central line remains adequately positioned with tip at the level of the mid SVC. Enteric tube passes below the diaphragm.  Cardiomediastinal silhouette is stable in size and configuration. Dense opacity at the right lung base is likely a combination of atelectasis and pleural effusion, not significantly changed compared to yesterday's exam but significantly increased compared to the exam of 09/10/2015. Linear opacity at the left lung base is compatible with atelectasis and/or scarring. There is central pulmonary vascular congestion and mild bilateral interstitial edema, not significantly changed. No pneumothorax seen. IMPRESSION: 1. Dense opacity at the right lung base, likely combination of atelectasis and pleural effusion, suspect moderate-sized pleural effusion. This is not significantly changed compared to yesterday's exam but has worsened compared to the earlier study of 09/14/2015. 2. Stable linear atelectasis/scarring at the left lung base. 3. Central pulmonary vascular congestion and mild interstitial edema suggesting mild CHF/volume overload, not significantly changed compared to yesterday's exam. 4. Stable mild cardiomegaly. 5. Endotracheal tube and right IJ central line remain adequately positioned. Electronically Signed   By: Bary Richard M.D.   On: 10/03/2015 07:52   CXR 05/01 PENDING  Discussion 50 yo male with a PMH  HTN, NASH admitted with fever, acidosis, AMS, severe sepsis secondary to strep pneumonia; now intubated and sedated; requiring high FiO2.   ASSESSMENT / PLAN:  PULMONARY A:  Acute hypoxic respiratory failure Severe hypoxemia-still requiring >50% FiO2 RLL CAP - pneumococcal Smoker without h/o COPD Possible para-pneumoic effusion P:   Cont full vent support with PCV mode - settings reviewed and/or adjusted Cont vent bundle Daily SBT if/when meets criteria Daily CXR ABG prn  CARDIOVASCULAR A:  New onset AFRVR - controlled with metoprolol Transient septic shock, resolved H/O hypertension P:  Monitor rhythm and BP Avoid amiodarone due to high FiO2 requirements and risk of oxidative  lung injury Continue full dose LMWH 04/30 for anticoagulation Will need out patient cardiology f/u  RENAL A:   AKI, nonoliguric Lactic acidosis - mild, due to severe sepsis P:   Monitor BMET intermittently Monitor I/Os Correct electrolytes as indicated Avoid nephrotoxic medications  GASTROINTESTINAL A:   Obesity NASH P:   SUP: enteral PPI TF protocol initiated 04/28 Monitor LFTs intermittently  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 P:  DVT px: full dose LMWH for AF Monitor CBC intermittently Transfuse per usual guidelines Monitor plt count closely while on LMWH  INFECTIOUS A:   Severe sepsis-persistent fever RLL pneumococcal PNA Possible R para-pneumonic effusion Minimal fluid seen on [place] of chest 04/30 P:   Monitor temp, WBC count Micro and abx as above Recheck PCT and WBC count 05/01 - if not resolving to normal as expected, consider CT chest to assess for loculated effusion  ENDOCRINE A:   No issues   P:   Monitor CBGs Consider SSI if glu > 180  NEUROLOGIC A:   Acute metabolic encephalopathy  Hyperammonemia  P:   RASS goal: -1, -2 PAD protocol  04/28 - propofol-4/28, PRN fentanyl, PRN midaz and fentanyl; gtt 04/28 Cont Lactulose and monitor ammonia level  Disposition and family update: No family at bedside. Continue treatment plan as above. Will communicate any changes to wife when present  Critical care time spent examining patient, establishing treatment plan, managing vent, reviewing history and labs, CXR, and ABG interpretation is 40 minutes  Magdalene S. [facility] ANP-BC Pulmonary and Critical Care Medicine [facility] Pager [phone]    10/04/2015, 5:33 AM   STAFF NOTE: I, Dr. Lucie Leather,  have personally reviewed patient's available data, including medical history, events of note, physical examination and test results as part of my evaluation. I have discussed with NP and other care providers  such as pharmacist, RN and RRT.  In addition,  I personally evaluated patient and elicited key findings   On vent, fio2 at 70%, PEEP at 10, sedated  A:severe hypoxic resp failure from pneumonia requiting vent support, now on PC mode  P:  wean fio2 as tolerated    The Rest per NP whose note is outlined above and that I agree with  I have personally reviewed/obtained a history, examined the patient, evaluated Pertinent laboratory and RadioGraphic/imaging results, and  formulated the assessment and plan   The Patient requires high complexity decision making for assessment and support, frequent evaluation and titration of therapies, application of advanced monitoring technologies and extensive interpretation of multiple databases. Critical Care Time devoted to patient care services described in this note is 40 minutes.  This Critical care time does not reflrect procedure time or supervisory time of NP but could involve care discussion time Overall, patient is critically ill, prognosis is guarded.  Patient with Multiorgan failure and at high risk for cardiac arrest and death.    Lucie Leather, M.D.  [facility]  Medical Director [facility] Medical Director [facility] Cardio-Pulmonary Department

## 2015-10-04 NOTE — Care Management (Signed)
Patient presented from home.  Prior to this admission independent in all his adls.  Severe sepsis- positive blood cultures, hypoxia due pneumonia and required intubation and ventilator.  Central line.  Was current with his PCP.  No family present to discuss further. have been able to decrease 02 requirements today

## 2015-10-04 NOTE — Progress Notes (Signed)
Nutrition Follow-up  DOCUMENTATION CODES:   Morbid obesity  INTERVENTION:  -Recommend continuing current TF regimen -Pt continues without BM; discussed poc during ICU rounds, plan to increase frequency of lactulose   NUTRITION DIAGNOSIS:   Inadequate oral intake related to acute illness as evidenced by NPO status, per patient/family report.  Being addressed via TF  GOAL:   Provide needs based on ASPEN/SCCM guidelines  MONITOR:   TF tolerance, Vent status, Labs, I & O's, Weight trends  REASON FOR ASSESSMENT:   Ventilator, Consult Enteral/tube feeding initiation and management  ASSESSMENT:   50 yo male admitted with severe sepsis, acute respiratory failure with suspected pneumonia, hx of NASH. Pt with respiratory distressed on admission, initially placed on BiPap but required intubation 09/19/2015  Pt remains on vent, requiring high FiO2 and pt febrile  Tolerating Vital High Protein at rate of 60 ml/hr, Prostat TID  Skin:  Reviewed, no issues  Last BM:  no BM documented since admission   Digestive System: abdomen distended but soft, no BM  Labs: reviewed  Meds: lactulose, dulcolax suppository prn, senokot prn  Height:   Ht Readings from Last 1 Encounters:  09/10/2015 5\' 10"  (1.778 m)    Weight:   Wt Readings from Last 1 Encounters:  10/04/15 287 lb 14.7 oz (130.6 kg)    BMI:  Body mass index is 41.31 kg/(m^2).  Estimated Nutritional Needs:   Kcal:  1485-1890 kcals  (11-14 kcals/kg using weight of 135.4 kg per ASPEN guidelines for BMI >30)  Protein:  >/= 151 g (2.0 g/kg)   Fluid:  >/= 1.5 L  EDUCATION NEEDS:   No education needs identified at this time  Romelle StarcherCate Kylina Vultaggio MS, RD, LDN [phone] Pager  [phone] Weekend/On-Call Pager

## 2015-10-04 DEATH — deceased

## 2015-10-05 ENCOUNTER — Inpatient Hospital Stay: Payer: BLUE CROSS/BLUE SHIELD

## 2015-10-05 DIAGNOSIS — J13 Pneumonia due to Streptococcus pneumoniae: Secondary | ICD-10-CM

## 2015-10-05 DIAGNOSIS — I4891 Unspecified atrial fibrillation: Secondary | ICD-10-CM

## 2015-10-05 DIAGNOSIS — J9601 Acute respiratory failure with hypoxia: Secondary | ICD-10-CM

## 2015-10-05 DIAGNOSIS — J9600 Acute respiratory failure, unspecified whether with hypoxia or hypercapnia: Secondary | ICD-10-CM

## 2015-10-05 LAB — CULTURE, BLOOD (ROUTINE X 2)

## 2015-10-05 LAB — GLUCOSE, CAPILLARY
GLUCOSE-CAPILLARY: 138 mg/dL — AB (ref 65–99)
Glucose-Capillary: 126 mg/dL — ABNORMAL HIGH (ref 65–99)

## 2015-10-05 MED ORDER — IPRATROPIUM-ALBUTEROL 0.5-2.5 (3) MG/3ML IN SOLN
3.0000 mL | RESPIRATORY_TRACT | Status: DC
  Administered 2015-10-05 – 2015-10-09 (×23): 3 mL via RESPIRATORY_TRACT
  Filled 2015-10-05 (×21): qty 3

## 2015-10-05 MED ORDER — METHYLPREDNISOLONE SODIUM SUCC 40 MG IJ SOLR
40.0000 mg | Freq: Two times a day (BID) | INTRAMUSCULAR | Status: DC
  Administered 2015-10-05 – 2015-10-07 (×6): 40 mg via INTRAVENOUS
  Filled 2015-10-05 (×6): qty 1

## 2015-10-05 MED ORDER — AMIODARONE HCL IN DEXTROSE 360-4.14 MG/200ML-% IV SOLN
60.0000 mg/h | INTRAVENOUS | Status: AC
  Administered 2015-10-05: 60 mg/h via INTRAVENOUS
  Filled 2015-10-05: qty 200

## 2015-10-05 MED ORDER — AMIODARONE HCL IN DEXTROSE 360-4.14 MG/200ML-% IV SOLN
30.0000 mg/h | INTRAVENOUS | Status: DC
Start: 2015-10-05 — End: 2015-10-07
  Administered 2015-10-05 – 2015-10-07 (×5): 30 mg/h via INTRAVENOUS
  Filled 2015-10-05 (×13): qty 200

## 2015-10-05 MED ORDER — IPRATROPIUM-ALBUTEROL 0.5-2.5 (3) MG/3ML IN SOLN
RESPIRATORY_TRACT | Status: AC
  Administered 2015-10-05: 3 mL
  Filled 2015-10-05: qty 3

## 2015-10-05 MED ORDER — BUDESONIDE 0.5 MG/2ML IN SUSP
0.5000 mg | Freq: Two times a day (BID) | RESPIRATORY_TRACT | Status: DC
  Administered 2015-10-05 – 2015-10-09 (×9): 0.5 mg via RESPIRATORY_TRACT
  Filled 2015-10-05 (×9): qty 2

## 2015-10-05 NOTE — Progress Notes (Signed)
ANTICOAGULATION CONSULT NOTE - Initial Consult  Pharmacy Consult for Lovenox Indication: atrial fibrillation  No Known Allergies  Patient Measurements: Height: 5' 10"  (177.8 cm) Weight: 287 lb 11.2 oz (130.5 kg) IBW/kg (Calculated) : 73   Vital Signs: Temp: 101.8 F (38.8 C) (05/02 1000) BP: 139/76 mmHg (05/02 1000) Pulse Rate: 129 (05/02 1000)  Labs:  Recent Labs  10/03/15 0425 10/04/15 0548  HGB 13.0 12.9*  HCT 39.0* 37.9*  PLT 84* 89*  CREATININE 1.88* 1.82*  TROPONINI 0.12*  --     Estimated Creatinine Clearance: 64.5 mL/min (by C-G formula based on Cr of 1.82).   Medical History: Past Medical History  Diagnosis Date  . Hypertension   . Cirrhosis ([place])     Medications:  Scheduled:  . antiseptic oral rinse  7 mL Mouth Rinse QID  . aspirin  81 mg Per Tube Daily  . budesonide (PULMICORT) nebulizer solution  0.5 mg Nebulization BID  . chlorhexidine gluconate (SAGE KIT)  15 mL Mouth Rinse BID  . enoxaparin (LOVENOX) injection  1 mg/kg Subcutaneous Q12H  . feeding supplement (PRO-STAT SUGAR FREE 64)  30 mL Per Tube TID  . folic acid  1 mg Per Tube Daily  . free water  100 mL Per Tube Q8H  . ipratropium-albuterol  3 mL Nebulization Q4H  . ipratropium-albuterol      . lactulose  30 g Per Tube TID  . methylPREDNISolone (SOLU-MEDROL) injection  40 mg Intravenous Q12H  . metoprolol tartrate  25 mg Per Tube BID  . pantoprazole sodium  40 mg Per Tube Q1200  . pencillin G potassium IV  4 Million Units Intravenous Q4H  . sodium chloride flush  10-40 mL Intracatheter Q12H   Infusions:  . amiodarone 60 mg/hr (10/05/15 0900)  . amiodarone    . feeding supplement (VITAL HIGH PROTEIN) 1,000 mL (10/05/15 0532)  . fentaNYL infusion INTRAVENOUS 250 mcg/hr (10/05/15 1158)    Assessment: Pharmacy consulted to dose Lovenox in a 50 yo male with atrial fibrillation.   Goal of Therapy:  Monitor SCr and CBC per policy  Plan:  Will continue patient on Lovenox 1 mg/kg  subq q12h (130 mg subq q12h) based on renal function.   Pharmacy will continue to follow.  Ulice Dash D 10/05/2015,2:08 PM

## 2015-10-05 NOTE — Consult Note (Signed)
[facility] Infectious Disease     Reason for Consult:Strep PNA bacteremia and multifocal pna    Referring Physician: Corbin Ade Date of Admission:  09/04/2015   Active Problems:   Severe sepsis ([place])   Acute respiratory failure ([place])   Pneumonia due to Streptococcus pneumoniae ([place])   A-fib ([place])   HPI: Jonathon Bryant is a 50 y.o. male admitted 4/28 with fevers and increased sob and AMS. Has has hx NASH, cirrhosis, HTN.  In ED was in resp distress and intubated. CXR showed PNA and bcx have turned + for Strep PNA. Since admit he has been on appropriate abx but he remains febrile and has leukocytosis.  Initially requiring high FiO2 but improving over last 24 hrs and now on 40%.   He developed what appeared to be a rash but has resolved.   Past Medical History  Diagnosis Date  . Hypertension   . Cirrhosis ([place])    History reviewed. No pertinent past surgical history. Social History  Substance Use Topics  . Smoking status: Current Every Day Smoker  . Smokeless tobacco: None  . Alcohol Use: None   History reviewed. No pertinent family history.  Allergies: No Known Allergies  Current antibiotics: Antibiotics Given (last 72 hours)    Date/Time Action Medication Dose Rate   10/04/15 0918 Given   levofloxacin (LEVAQUIN) IVPB 750 mg 750 mg 100 mL/hr   10/04/15 1420 Given   penicillin G potassium 4 Million Units in dextrose 5 % 250 mL IVPB 4 Million Units 250 mL/hr   10/04/15 1643 Given   penicillin G potassium 4 Million Units in dextrose 5 % 250 mL IVPB 4 Million Units 250 mL/hr   10/04/15 1918 Given   penicillin G potassium 4 Million Units in dextrose 5 % 250 mL IVPB 4 Million Units 250 mL/hr   10/04/15 2353 Given   penicillin G potassium 4 Million Units in dextrose 5 % 250 mL IVPB 4 Million Units 250 mL/hr   10/05/15 0421 Given   penicillin G potassium 4 Million Units in dextrose 5 % 250 mL IVPB 4 Million Units 250 mL/hr   10/05/15 0945 Given  [Med unavailable from pharmacy]    penicillin G potassium 4 Million Units in dextrose 5 % 250 mL IVPB 4 Million Units 250 mL/hr   10/05/15 1315 Given   penicillin G potassium 4 Million Units in dextrose 5 % 250 mL IVPB 4 Million Units 250 mL/hr      MEDICATIONS: . antiseptic oral rinse  7 mL Mouth Rinse QID  . aspirin  81 mg Per Tube Daily  . budesonide (PULMICORT) nebulizer solution  0.5 mg Nebulization BID  . chlorhexidine gluconate (SAGE KIT)  15 mL Mouth Rinse BID  . enoxaparin (LOVENOX) injection  1 mg/kg Subcutaneous Q12H  . feeding supplement (PRO-STAT SUGAR FREE 64)  30 mL Per Tube TID  . folic acid  1 mg Per Tube Daily  . free water  100 mL Per Tube Q8H  . ipratropium-albuterol  3 mL Nebulization Q4H  . lactulose  30 g Per Tube TID  . methylPREDNISolone (SOLU-MEDROL) injection  40 mg Intravenous Q12H  . metoprolol tartrate  25 mg Per Tube BID  . pantoprazole sodium  40 mg Per Tube Q1200  . pencillin G potassium IV  4 Million Units Intravenous Q4H  . sodium chloride flush  10-40 mL Intracatheter Q12H    Review of Systems - unable to obtain  OBJECTIVE: Temp:  [100.2 F (37.9 C)-102.2 F (  39 C)] 100.8 F (38.2 C) (05/02 1400) Pulse Rate:  [66-130] 110 (05/02 1400) Resp:  [13-27] 27 (05/02 1400) BP: (101-159)/(61-89) 159/88 mmHg (05/02 1400) SpO2:  [89 %-97 %] 89 % (05/02 1524) FiO2 (%):  [40 %-60 %] 40 % (05/02 1120) Weight:  [130.5 kg (287 lb 11.2 oz)] 130.5 kg (287 lb 11.2 oz) (05/02 0500) Physical Exam  Constitutional: obese critically ill appearing. INtubated, OGT in place  HENT: anicteric  Mouth/Throat: Oropharynx ett and OGT inplace Cardiovascular: tachy Pulmonary/Chest: bil rhonchi Abdominal: Soft. Bowel sounds are normal. He exhibits no distension. There is no tenderness.  Lymphadenopathy: He has no cervical adenopathy.  Neurological: sedated  Skin: Skin is warm and dry. No rash noted except for some telengecatasis on arms .  Psychiatric: intubated Ext trace edema bil GU  Foley in  place Access R Neck line   LABS: Results for orders placed or performed during the hospital encounter of 09/26/2015 (from the past 48 hour(s))  Glucose, capillary     Status: Abnormal   Collection Time: 10/04/15 12:29 AM  Result Value Ref Range   Glucose-Capillary 132 (H) 65 - 99 mg/dL  Procalcitonin     Status: None   Collection Time: 10/04/15  5:48 AM  Result Value Ref Range   Procalcitonin 12.03 ng/mL    Comment:        Interpretation: PCT >= 10 ng/mL: Important systemic inflammatory response, almost exclusively due to severe bacterial sepsis or septic shock. (NOTE)         ICU PCT Algorithm               Non ICU PCT Algorithm    ----------------------------     ------------------------------         PCT < 0.25 ng/mL                 PCT < 0.1 ng/mL     Stopping of antibiotics            Stopping of antibiotics       strongly encouraged.               strongly encouraged.    ----------------------------     ------------------------------       PCT level decrease by               PCT < 0.25 ng/mL       >= 80% from peak PCT       OR PCT 0.25 - 0.5 ng/mL          Stopping of antibiotics                                             encouraged.     Stopping of antibiotics           encouraged.    ----------------------------     ------------------------------       PCT level decrease by              PCT >= 0.25 ng/mL       < 80% from peak PCT        AND PCT >= 0.5 ng/mL             Continuing antibiotics  encouraged.       Continuing antibiotics            encouraged.    ----------------------------     ------------------------------     PCT level increase compared          PCT > 0.5 ng/mL         with peak PCT AND          PCT >= 0.5 ng/mL             Escalation of antibiotics                                          strongly encouraged.      Escalation of antibiotics        strongly encouraged.   Comprehensive metabolic panel      Status: Abnormal   Collection Time: 10/04/15  5:48 AM  Result Value Ref Range   Sodium 144 135 - 145 mmol/L   Potassium 4.4 3.5 - 5.1 mmol/L   Chloride 106 101 - 111 mmol/L   CO2 31 22 - 32 mmol/L   Glucose, Bld 131 (H) 65 - 99 mg/dL   BUN 82 (H) 6 - 20 mg/dL   Creatinine, Ser 1.82 (H) 0.61 - 1.24 mg/dL   Calcium 8.3 (L) 8.9 - 10.3 mg/dL   Total Protein 5.8 (L) 6.5 - 8.1 g/dL   Albumin 2.3 (L) 3.5 - 5.0 g/dL   AST 133 (H) 15 - 41 U/L   ALT 47 17 - 63 U/L   Alkaline Phosphatase 80 38 - 126 U/L   Total Bilirubin 2.9 (H) 0.3 - 1.2 mg/dL   GFR calc non Af Amer 41 (L) >60 mL/min   GFR calc Af Amer 48 (L) >60 mL/min    Comment: (NOTE) The eGFR has been calculated using the CKD EPI equation. This calculation has not been validated in all clinical situations. eGFR's persistently <60 mL/min signify possible Chronic Kidney Disease.    Anion gap 7 5 - 15  CBC     Status: Abnormal   Collection Time: 10/04/15  5:48 AM  Result Value Ref Range   WBC 16.2 (H) 3.8 - 10.6 K/uL   RBC 3.72 (L) 4.40 - 5.90 MIL/uL   Hemoglobin 12.9 (L) 13.0 - 18.0 g/dL   HCT 37.9 (L) 40.0 - 52.0 %   MCV 102.1 (H) 80.0 - 100.0 fL   MCH 34.6 (H) 26.0 - 34.0 pg   MCHC 33.9 32.0 - 36.0 g/dL   RDW 15.5 (H) 11.5 - 14.5 %   Platelets 89 (L) 150 - 440 K/uL  Glucose, capillary     Status: Abnormal   Collection Time: 10/04/15  4:28 PM  Result Value Ref Range   Glucose-Capillary 126 (H) 65 - 99 mg/dL  Glucose, capillary     Status: Abnormal   Collection Time: 10/04/15 11:50 PM  Result Value Ref Range   Glucose-Capillary 138 (H) 65 - 99 mg/dL   Comment 1 Notify RN   Glucose, capillary     Status: Abnormal   Collection Time: 10/05/15  7:11 AM  Result Value Ref Range   Glucose-Capillary 126 (H) 65 - 99 mg/dL   No components found for: ESR, C REACTIVE PROTEIN MICRO: Recent Results (from the past 720 hour(s))  Blood culture (routine x 2)     Status: Abnormal   Collection Time: 09/10/2015  11:15 AM  Result Value Ref  Range Status   Specimen Description BLOOD RIGHT ANTECUBITAL  Final   Special Requests BOTTLES DRAWN AEROBIC AND ANAEROBIC 1CC  Final   Culture  Setup Time   Final    GRAM POSITIVE COCCI IN BOTH AEROBIC AND ANAEROBIC BOTTLES CRITICAL RESULT CALLED TO, READ BACK BY AND VERIFIED WITH: NATE COOKSON AT 6789 10/02/15.PMH CONFIRMED BY R[facility]    Culture (A)  Final    STREPTOCOCCUS PNEUMONIAE IN BOTH AEROBIC AND ANAEROBIC BOTTLES    Report Status 10/04/2015 FINAL  Final   Organism ID, Bacteria STREPTOCOCCUS PNEUMONIAE  Final      Susceptibility   Streptococcus pneumoniae - MIC*    PENICILLIN Value in next row       <=0.06SENSITIVE    ERYTHROMYCIN Value in next row Sensitive      <=0.06SENSITIVE    LEVOFLOXACIN Value in next row Sensitive      <=0.06SENSITIVE    VANCOMYCIN Value in next row Sensitive      <=0.06SENSITIVE    CEFTRIAXONE Value in next row Sensitive      <=0.06SENSITIVE    * STREPTOCOCCUS PNEUMONIAE  Blood Culture ID Panel (Reflexed)     Status: Abnormal   Collection Time: 09/16/2015 11:15 AM  Result Value Ref Range Status   Enterococcus species NOT DETECTED NOT DETECTED Final   Vancomycin resistance NOT DETECTED NOT DETECTED Final   Listeria monocytogenes NOT DETECTED NOT DETECTED Final   Staphylococcus species NOT DETECTED NOT DETECTED Final   Staphylococcus aureus NOT DETECTED NOT DETECTED Final   Methicillin resistance NOT DETECTED NOT DETECTED Final   Streptococcus species DETECTED (A) NOT DETECTED Final    Comment: CRITICAL RESULT CALLED TO, READ BACK BY AND VERIFIED WITH: NATE COOKSON AT 0154 10/02/15.PMH    Streptococcus agalactiae NOT DETECTED NOT DETECTED Final   Streptococcus pneumoniae DETECTED (A) NOT DETECTED Final    Comment: CRITICAL RESULT CALLED TO, READ BACK BY AND VERIFIED WITH: NATE COOKSON AT 0154 10/02/15.PMH    Streptococcus pyogenes NOT DETECTED NOT DETECTED Final   Acinetobacter baumannii NOT DETECTED NOT DETECTED Final   Enterobacteriaceae  species NOT DETECTED NOT DETECTED Final   Enterobacter cloacae complex NOT DETECTED NOT DETECTED Final   Escherichia coli NOT DETECTED NOT DETECTED Final   Klebsiella oxytoca NOT DETECTED NOT DETECTED Final   Klebsiella pneumoniae NOT DETECTED NOT DETECTED Final   Proteus species NOT DETECTED NOT DETECTED Final   Serratia marcescens NOT DETECTED NOT DETECTED Final   Carbapenem resistance NOT DETECTED NOT DETECTED Final   Haemophilus influenzae NOT DETECTED NOT DETECTED Final   Neisseria meningitidis NOT DETECTED NOT DETECTED Final   Pseudomonas aeruginosa NOT DETECTED NOT DETECTED Final   Candida albicans NOT DETECTED NOT DETECTED Final   Candida glabrata NOT DETECTED NOT DETECTED Final   Candida krusei NOT DETECTED NOT DETECTED Final   Candida parapsilosis NOT DETECTED NOT DETECTED Final   Candida tropicalis NOT DETECTED NOT DETECTED Final  Blood culture (routine x 2)     Status: Abnormal   Collection Time: 09/06/2015 11:54 AM  Result Value Ref Range Status   Specimen Description BLOOD RIGHT FOREARM  Final   Special Requests BOTTLES DRAWN AEROBIC AND ANAEROBIC 2CC  Final   Culture  Setup Time   Final    GRAM POSITIVE COCCI IN BOTH AEROBIC AND ANAEROBIC BOTTLES CRITICAL RESULT CALLED TO, READ BACK BY AND VERIFIED WITH: NATE COOKSON AT 0154 10/02/15.PMH CONFIRMED BY RWW    Culture (A)  Final  STREPTOCOCCUS PNEUMONIAE IN BOTH AEROBIC AND ANAEROBIC BOTTLES    Report Status 10/05/2015 FINAL  Final   Organism ID, Bacteria STREPTOCOCCUS PNEUMONIAE  Final      Susceptibility   Streptococcus pneumoniae - MIC*    ERYTHROMYCIN <=0.12 SENSITIVE Sensitive     LEVOFLOXACIN 1 SENSITIVE Sensitive     VANCOMYCIN 0.5 SENSITIVE Sensitive     * STREPTOCOCCUS PNEUMONIAE  MRSA PCR Screening     Status: None   Collection Time: 09/18/2015  6:31 PM  Result Value Ref Range Status   MRSA by PCR NEGATIVE NEGATIVE Final    Comment:        The GeneXpert MRSA Assay (FDA approved for NASAL  specimens only), is one component of a comprehensive MRSA colonization surveillance program. It is not intended to diagnose MRSA infection nor to guide or monitor treatment for MRSA infections.   Culture, respiratory (tracheal aspirate)     Status: None   Collection Time: 09/26/2015  8:02 PM  Result Value Ref Range Status   Specimen Description TRACHEAL ASPIRATE  Final   Special Requests NONE  Final   Gram Stain MANY WBC SEEN RARE GRAM POSITIVE COCCI   Final   Culture NO GROWTH 3 DAYS  Final   Report Status 10/04/2015 FINAL  Final    IMAGING: Dg Chest 1 View  09/25/2015  CLINICAL DATA:  Central line placement and intubation. EXAM: CHEST 1 VIEW COMPARISON:  Chest x-ray from earlier same day. FINDINGS: Endotracheal tube is well positioned with tip just above the level of the carina. Right IJ central line is well positioned with tip overlying the mid SVC. No pneumothorax seen. Study is hypoinspiratory. The bibasilar opacities seen on the chest x-ray from earlier same day are difficult to characterize due to the low lung volumes. Heart size appears grossly stable. IMPRESSION: 1. Endotracheal tube well positioned with tip just above the level of the carina. 2. Right-sided central line well positioned with tip at the level of the mid SVC. 3. No procedural related complication seen. Electronically Signed   By: Franki Cabot M.D.   On: 10/03/2015 13:35   Ct Chest Wo Contrast  10/05/2015  CLINICAL DATA:  50 year old male inpatient admitted with fever, sepsis and strep pneumonia, intubated requiring high FiO2. EXAM: CT CHEST WITHOUT CONTRAST TECHNIQUE: Multidetector CT imaging of the chest was performed following the standard protocol without IV contrast. COMPARISON:  Chest radiograph from one day prior. 03/23/2014 chest CT angiogram. FINDINGS: Motion degraded study. Mediastinum/Nodes: Normal heart size. No pericardial fluid/thickening. Left main, left anterior descending, left circumflex and right  coronary atherosclerosis. Atherosclerotic nonaneurysmal thoracic aorta. Normal caliber pulmonary arteries. Normal visualized thyroid. Enteric tube terminates in the distal body of the stomach. No appreciable esophageal wall thickening. No axillary adenopathy. Mild right paratracheal, AP window and subcarinal lymphadenopathy, for example a 1.3 cm right paratracheal node (series 2/image 40), a 1.0 cm AP window node (series 2/ image 42) and a 1.2 cm subcarinal node (series 2/image 60), all new since 03/23/2014. No gross hilar adenopathy on this noncontrast study. Lungs/Pleura: Endotracheal tube is well positioned with the tip 3.6 cm above the carina. No pneumothorax. Layering small right pleural effusion. No left pleural effusion. There is extensive patchy consolidation and ground-glass opacity throughout both lungs involving all lung lobes. There is volume loss in the dependent right lower lobe in keeping with mild compressive atelectasis. No significant pulmonary nodules are seen in the aerated portions of the lungs. Upper abdomen: Diffusely irregular liver surface, consistent  with cirrhosis. Trace upper abdominal ascites. Partially visualized splenomegaly. Top-normal size gastrohepatic ligament nodes measuring up to 0.9 cm appear stable. Musculoskeletal: No aggressive appearing focal osseous lesions. Mild degenerative changes in the thoracic spine. Symmetric mild gynecomastia. IMPRESSION: 1. Extensive patchy consolidation and ground-glass opacity throughout both lungs involving all lung lobes, most suggestive of severe multifocal pneumonia and/or acute interstitial pneumonia (AIP)/ARDS. 2. Small right pleural effusion. 3. Mild mediastinal lymphadenopathy is nonspecific, probably reactive. 4. Cirrhosis. Partially visualized splenomegaly. Trace ascites in the upper abdomen. 5. Left main and 3 vessel coronary atherosclerosis. 6. Well-positioned enteric and endotracheal tubes. Electronically Signed   By: Ilona Sorrel  M.D.   On: 10/05/2015 14:23   US Abdomen Limited  09/07/2015  CLINICAL DATA:  50 year old male with cirrhosis and sepsis of uncertain etiology. Evaluate for ascites for potential spontaneous bacterial peritonitis. EXAM: LIMITED ABDOMEN ULTRASOUND FOR ASCITES TECHNIQUE: Limited ultrasound survey for ascites was performed in all four abdominal quadrants. COMPARISON:  Prior CT abdomen/ pelvis 08/30/2014 FINDINGS: Sonographic interrogation of the 4 quadrants of the abdomen demonstrates no discernible ascites. IMPRESSION: Negative for ascites. Electronically Signed   By: Jacqulynn Cadet M.D.   On: 09/29/2015 15:25   Dg Chest Port 1 View  10/04/2015  CLINICAL DATA:  Respiratory failure. EXAM: PORTABLE CHEST 1 VIEW COMPARISON:  10/03/2015. FINDINGS: Endotracheal tube 4 cm above carina. Central venous catheter tip proximal SVC. Prior cervical fusion. Cardiomegaly. Improved RIGHT pleural effusion. Likely consolidation at the RIGHT base, better visualized due to improving pleural effusion. Platelike atelectasis on the LEFT improved. Mild retrocardiac opacity, stable. IMPRESSION: Improved aeration. Tubes and lines are stable. Persistent RIGHT base opacity, likely consolidation. Electronically Signed   By: Staci Righter M.D.   On: 10/04/2015 07:37   Dg Chest Port 1 View  10/03/2015  CLINICAL DATA:  Respiratory failure. History of hypertension, cirrhosis, daily smoking. EXAM: PORTABLE CHEST 1 VIEW COMPARISON:  Chest x-rays dated 10/02/2015 and 09/10/2015. FINDINGS: Endotracheal tube remains well positioned with tip just above the level of the carina. Right IJ central line remains adequately positioned with tip at the level of the mid SVC. Enteric tube passes below the diaphragm. Cardiomediastinal silhouette is stable in size and configuration. Dense opacity at the right lung base is likely a combination of atelectasis and pleural effusion, not significantly changed compared to yesterday's exam but significantly  increased compared to the exam of 09/17/2015. Linear opacity at the left lung base is compatible with atelectasis and/or scarring. There is central pulmonary vascular congestion and mild bilateral interstitial edema, not significantly changed. No pneumothorax seen. IMPRESSION: 1. Dense opacity at the right lung base, likely combination of atelectasis and pleural effusion, suspect moderate-sized pleural effusion. This is not significantly changed compared to yesterday's exam but has worsened compared to the earlier study of 09/09/2015. 2. Stable linear atelectasis/scarring at the left lung base. 3. Central pulmonary vascular congestion and mild interstitial edema suggesting mild CHF/volume overload, not significantly changed compared to yesterday's exam. 4. Stable mild cardiomegaly. 5. Endotracheal tube and right IJ central line remain adequately positioned. Electronically Signed   By: Franki Cabot M.D.   On: 10/03/2015 07:52   Dg Chest Port 1 View  10/02/2015  CLINICAL DATA:  Pt admitted yesterday, f/u acute respiratory failure, diaphoretic EXAM: PORTABLE CHEST 1 VIEW COMPARISON:  09/30/2015 FINDINGS: Stable support devices. Bibasilar opacities again identified slightly worse/ more extensive bilaterally. Relatively hypoventilatory state. Cardiac silhouette appears enlarged. IMPRESSION: Mildly increased bibasilar opacities. Electronically Signed   By: Elodia Florence.D.  On: 10/02/2015 07:18   Dg Chest Portable 1 View  09/21/2015  CLINICAL DATA:  Respiratory distress. History of cirrhosis and hypertension. EXAM: PORTABLE CHEST 1 VIEW COMPARISON:  None. FINDINGS: Examination is degraded due to patient body habitus and hypoventilation. Grossly unchanged enlarged cardiac silhouette and mediastinal contours given decreased lung volumes. Parent development of bibasilar heterogeneous airspace opacities, right greater than left. Mild pulmonary is congestion without frank evidence of edema. No pleural effusion or  pneumothorax. No acute osseous abnormalities. Post lower cervical ACDF, incompletely evaluated. IMPRESSION: 1. Degraded examination with development of bibasilar heterogeneous airspace opacities, right greater than left, worrisome for multifocal infection. 2. Cardiomegaly and pulmonary venous congestion without frank evidence of edema. Electronically Signed   By: Sandi Mariscal M.D.   On: 10/02/2015 11:33   Dg Abd Portable 1v  09/25/2015  CLINICAL DATA:  Orogastric tube placement EXAM: PORTABLE ABDOMEN - 1 VIEW COMPARISON:  None. FINDINGS: NG tube projects in left upper quadrant over the stomach. There are a few mildly dilated loops of small bowel present. IMPRESSION: Orogastric tube projects over the stomach. Electronically Signed   By: Skipper Cliche M.D.   On: 09/30/2015 13:36    Assessment:   Jonathon Bryant is a 50 y.o. male with Strep PNA bacteremia, R lower lobe infiltrate with persistent fevers and leukocytosis, and an evanescent rash. ECHO neg for veg.  I am concerned that he has an empyema.  I placed [place] on R lower post chest and could visualized some pleural fluid but difficult to quantify.   Recommendations Agree with IV PCN If still febrile in AM would attempt thoracentesis.   Thank you very much for allowing me to participate in the care of this patient. Please call with questions.   Cheral Marker. Ola Spurr, MD

## 2015-10-05 NOTE — Progress Notes (Signed)
Pt lying in bed remains intubated and on fentanyl for sedation. Pt opens eyes but has no purposeful movement. Pt remains febrile and received tylenol as ordered. Pt , has had a BM this shift. Further assesment via flowsheet

## 2015-10-05 NOTE — Progress Notes (Signed)
PULMONARY / CRITICAL CARE MEDICINE   Name: Jonathon MusicStephen E Brockwell MRN: [id number] DOB: 06/14/1965    ADMISSION DATE:  2016-03-23   PT PROFILE:   [age] M with Htn, NASH admitted with fever, acidosis, AMS, severe sepsis, presumed RLL PNA. Intubated in ED. Developed AFRVR in ED  Subjective Still requiring high FiO2 and febrile. Tylenol and motrin give. Tolerated a bath without dropping O2 saturation. No other issues overnight, now with afib with  RVR, fio2 at 60%, PEEp at 10  MAJOR EVENTS/TEST RESULTS: 04/28 Admitted with diagnoses of severe sepsis, acute respiratory failure, suspected PNA, h/o NASH. Developed AFRVR in ED and suffered NSVT while CVL being placed 04/28 Abdominal US: No ascites seen 04/28 TTE: LVEF 50-55%. LA mildly dilated. Normal PA pressures 04/29 BC positive for pneumococcus 04/29 refractory hypoxemia - vent changed to PC mode 04/30 improved oxygenation. AF persists. Full dose enoxaparin initiated 5/2 afib with rvr-amiodarone started  INDWELLING DEVICES:: ETT 04/28 >>  R IJ CVL 04/28 >>   MICRO DATA: MRSA PCR 04/28 >> NEG Urine 04/28 >> UA negative Resp  04/28 >>  Blood 04/28 >> 2/2 pneumococcus  PCT 04/28 - 04/30: 19.05, 23.35, 17.25  ANTIMICROBIALS:  Vanc 04/28 >> 04/29 Levofloxacin 04/28 >>     VITAL SIGNS: BP 120/80 mmHg  Pulse 130  Temp(Src) 101.5 F (38.6 C) (Other (Comment))  Resp 17  Ht 5\' 10"  (1.778 m)  Wt 287 lb 11.2 oz (130.5 kg)  BMI 41.28 kg/m2  SpO2 95%  HEMODYNAMICS:    VENTILATOR SETTINGS: Vent Mode:  [-] PCV FiO2 (%):  [60 %] 60 % Set Rate:  [16 bmp] 16 bmp PEEP:  [8 cmH20] 8 cmH20 Pressure Support:  [8 cmH20] 8 cmH20  INTAKE / OUTPUT: I/O last 3 completed shifts: In: 3968.8 [I.V.:387.8; NG/GT:2581; IV Piggyback:1000] Out: 3675 [Urine:3675]  PHYSICAL EXAMINATION: General: Obese, RASS -4, intubated and sedated/NMB Neuro: Limited exam due to NMB HEENT: PERRLA, mild scleral edema, ETT, oral mucosa pink Cardiovascular: IRIR in  the low 100s, no MRG  Lungs: Bilateral airflow, dependent R basilar crackles and rhonchi, mild expiratory wheezes in right upper lung fields Abdomen: obese, soft, ND, diminished BS Ext: warm, no edema Skin: limited exam, no lesions noted  LABS:  BMET  Recent Labs Lab 10/02/15 0505 10/03/15 0425 10/04/15 0548  NA 136 139 144  K 4.2 4.4 4.4  CL 101 102 106  CO2 26 28 31   BUN 49* 75* 82*  CREATININE 1.85* 1.88* 1.82*  GLUCOSE 104* 136* 131*    Electrolytes  Recent Labs Lab 10/02/15 0505 10/03/15 0425 10/04/15 0548  CALCIUM 7.6* 7.7* 8.3*    CBC  Recent Labs Lab 10/02/15 0505 10/03/15 0425 10/04/15 0548  WBC 14.7* 16.3* 16.2*  HGB 13.1 13.0 12.9*  HCT 39.0* 39.0* 37.9*  PLT 64* 84* 89*    Coag's  Recent Labs Lab 07/13/2015 1115  APTT 29  INR 1.68    Sepsis Markers  Recent Labs Lab 07/13/2015 1625 07/13/2015 1950 07/13/2015 2332 10/02/15 0505 10/03/15 0425 10/04/15 0548  LATICACIDVEN 7.6* 7.3* 5.2*  --   --   --   PROCALCITON  --   --   --  23.35 17.25 12.03    ABG  Recent Labs Lab 07/13/2015 2002 07/13/2015 2211 10/02/15 0646  PHART 7.29* 7.31* 7.20*  PCO2ART 36 44 77*  PO2ART 59* 64* 61*    Liver Enzymes  Recent Labs Lab 10/02/15 0505 10/03/15 0425 10/04/15 0548  AST 65* 113* 133*  ALT 38  44 47  ALKPHOS 39 61 80  BILITOT 2.7* 2.5* 2.9*  ALBUMIN 2.6* 2.5* 2.3*    Cardiac Enzymes  Recent Labs Lab 28-Oct-2015 1115 10/02/15 0505 10/03/15 0425  TROPONINI 0.05* 0.14* 0.12*    Glucose  Recent Labs Lab 10/02/15 0026 10/02/15 2339 10/04/15 0029 10/04/15 1628 10/04/15 2350 10/05/15 0711  GLUCAP 81 121* 132* 126* 138* 126*    No results found. CXR 05/01 PENDING  Discussion 50 yo male with a PMH  HTN, NASH admitted with fever, acidosis, AMS, severe sepsis secondary to strep pneumonia; now intubated and sedated; requiring high FiO2.   ASSESSMENT / PLAN:  PULMONARY A:  Acute hypoxic respiratory failure Severe  hypoxemia-still requiring >50% FiO2 RLL CAP - pneumococcal Smoker without h/o COPD Possible para-pneumoic effusion P:   Cont full vent support with PCV mode - settings reviewed and/or adjusted Cont vent bundle Daily SBT if/when meets criteria Daily CXR ABG prn  CARDIOVASCULAR A:  New onset AFRVR - controlled with metoprolol Transient septic shock, resolved H/O hypertension P:  Monitor rhythm and BP Start amiodarone  Continue full dose LMWH 04/30 for anticoagulation   RENAL A:   AKI, nonoliguric Lactic acidosis - mild, due to severe sepsis P:   Monitor BMET intermittently Monitor I/Os Correct electrolytes as indicated Avoid nephrotoxic medications  GASTROINTESTINAL A:   Obesity NASH P:   SUP: enteral PPI TF protocol initiated 04/28 Monitor LFTs intermittently  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 P:  DVT px: full dose LMWH for AF Monitor CBC intermittently Transfuse per usual guidelines Monitor plt count closely while on LMWH  INFECTIOUS A:   Severe sepsis-persistent fever RLL pneumococcal PNA Possible R para-pneumonic effusion Minimal fluid seen on [place] of chest 04/30 Still with fevers, now with rash, ID consulted P:   Monitor temp, WBC count Micro and abx as above Recheck PCT and WBC count 05/01 - if not resolving to normal as expected, will obtain Ct chest to assess lung fields ID consulted  ENDOCRINE A:   No issues   P:   Monitor CBGs Consider SSI if glu > 180  NEUROLOGIC A:   Acute metabolic encephalopathy  Hyperammonemia  P:   RASS goal: -1, -2 PAD protocol 04/28 - propofol-4/28, PRN fentanyl, PRN midaz and fentanyl; gtt 04/28 Cont Lactulose and monitor ammonia level  Disposition and family update: No family at bedside. Continue treatment plan as above. Will communicate any changes to wife when present  I have personally reviewed/obtained a history, examined the patient, evaluated Pertinent  laboratory and RadioGraphic/imaging results, and  formulated the assessment and plan   The Patient requires high complexity decision making for assessment and support, frequent evaluation and titration of therapies, application of advanced monitoring technologies and extensive interpretation of multiple databases. Critical Care Time devoted to patient care services described in this note is 40 minutes.  This Critical care time does not reflrect procedure time or supervisory time of NP but could involve care discussion time Overall, patient is critically ill, prognosis is guarded.  Patient with Multiorgan failure and at high risk for cardiac arrest and death.    Lucie Leather, M.D.  [facility]  Medical Director [facility] Medical Director [facility] Cardio-Pulmonary Department

## 2015-10-05 NOTE — Plan of Care (Signed)
Problem: ICU Phase Progression Outcomes Goal: O2 sats trending toward baseline Outcome: Not Progressing Pt O2 sats remain in high 80s to low 90s.

## 2015-10-06 ENCOUNTER — Inpatient Hospital Stay: Payer: BLUE CROSS/BLUE SHIELD

## 2015-10-06 DIAGNOSIS — I481 Persistent atrial fibrillation: Secondary | ICD-10-CM

## 2015-10-06 LAB — BASIC METABOLIC PANEL
Anion gap: 10 (ref 5–15)
BUN: 100 mg/dL — ABNORMAL HIGH (ref 6–20)
CALCIUM: 8.6 mg/dL — AB (ref 8.9–10.3)
CHLORIDE: 106 mmol/L (ref 101–111)
CO2: 31 mmol/L (ref 22–32)
CREATININE: 2.21 mg/dL — AB (ref 0.61–1.24)
GFR calc non Af Amer: 32 mL/min — ABNORMAL LOW (ref >60)
GFR, EST AFRICAN AMERICAN: 38 mL/min — AB (ref >60)
Glucose, Bld: 219 mg/dL — ABNORMAL HIGH (ref 65–99)
Potassium: 4.9 mmol/L (ref 3.5–5.1)
SODIUM: 147 mmol/L — AB (ref 135–145)

## 2015-10-06 LAB — CBC
HCT: 38.4 % — ABNORMAL LOW (ref 40.0–52.0)
Hemoglobin: 13.1 g/dL (ref 13.0–18.0)
MCH: 35.4 pg — ABNORMAL HIGH (ref 26.0–34.0)
MCHC: 34 g/dL (ref 32.0–36.0)
MCV: 104 fL — ABNORMAL HIGH (ref 80.0–100.0)
PLATELETS: 118 10*3/uL — AB (ref 150–440)
RBC: 3.7 MIL/uL — AB (ref 4.40–5.90)
RDW: 15.8 % — ABNORMAL HIGH (ref 11.5–14.5)
WBC: 20.1 10*3/uL — AB (ref 3.8–10.6)

## 2015-10-06 LAB — GLUCOSE, CAPILLARY
GLUCOSE-CAPILLARY: 132 mg/dL — AB (ref 65–99)
GLUCOSE-CAPILLARY: 144 mg/dL — AB (ref 65–99)
GLUCOSE-CAPILLARY: 179 mg/dL — AB (ref 65–99)
Glucose-Capillary: 172 mg/dL — ABNORMAL HIGH (ref 65–99)
Glucose-Capillary: 180 mg/dL — ABNORMAL HIGH (ref 65–99)

## 2015-10-06 MED ORDER — STERILE WATER FOR INJECTION IJ SOLN
INTRAMUSCULAR | Status: AC
  Administered 2015-10-06: 12:00:00
  Filled 2015-10-06: qty 10

## 2015-10-06 MED ORDER — VECURONIUM BROMIDE 10 MG IV SOLR
10.0000 mg | Freq: Once | INTRAVENOUS | Status: AC
  Administered 2015-10-06: 10 mg via INTRAVENOUS

## 2015-10-06 MED ORDER — VECURONIUM BROMIDE 10 MG IV SOLR
INTRAVENOUS | Status: AC
  Filled 2015-10-06: qty 10

## 2015-10-06 NOTE — Progress Notes (Signed)
ANTICOAGULATION CONSULT NOTE - Initial Consult  Pharmacy Consult for Lovenox Indication: atrial fibrillation  No Known Allergies  Patient Measurements: Height: 5' 10"  (177.8 cm) Weight: 289 lb 11 oz (131.4 kg) IBW/kg (Calculated) : 73   Vital Signs: Temp: 100.8 F (38.2 C) (05/03 1000) Temp Source: Oral (05/03 0700) BP: 120/65 mmHg (05/03 1000) Pulse Rate: 106 (05/03 1000)  Labs:  Recent Labs  10/04/15 0548 10/06/15 0510  HGB 12.9* 13.1  HCT 37.9* 38.4*  PLT 89* 118*  CREATININE 1.82* 2.21*    Estimated Creatinine Clearance: 53.3 mL/min (by C-G formula based on Cr of 2.21).   Medical History: Past Medical History  Diagnosis Date  . Hypertension   . Cirrhosis ([place])     Medications:  Scheduled:  . antiseptic oral rinse  7 mL Mouth Rinse QID  . aspirin  81 mg Per Tube Daily  . budesonide (PULMICORT) nebulizer solution  0.5 mg Nebulization BID  . chlorhexidine gluconate (SAGE KIT)  15 mL Mouth Rinse BID  . enoxaparin (LOVENOX) injection  1 mg/kg Subcutaneous Q12H  . feeding supplement (PRO-STAT SUGAR FREE 64)  30 mL Per Tube TID  . folic acid  1 mg Per Tube Daily  . free water  100 mL Per Tube Q8H  . ipratropium-albuterol  3 mL Nebulization Q4H  . lactulose  30 g Per Tube TID  . methylPREDNISolone (SOLU-MEDROL) injection  40 mg Intravenous Q12H  . metoprolol tartrate  25 mg Per Tube BID  . pantoprazole sodium  40 mg Per Tube Q1200  . pencillin G potassium IV  4 Million Units Intravenous Q4H  . sodium chloride flush  10-40 mL Intracatheter Q12H  . [COMPLETED] sterile water (preservative free)      . [COMPLETED] vecuronium       Infusions:  . amiodarone 30 mg/hr (10/06/15 0135)  . feeding supplement (VITAL HIGH PROTEIN) 1,000 mL (10/05/15 2201)  . fentaNYL infusion INTRAVENOUS 300 mcg/hr (10/06/15 0514)    Assessment: Pharmacy consulted to dose Lovenox in a 50 yo male with atrial fibrillation.   Goal of Therapy:  Monitor SCr and CBC per  policy  Plan:  Will continue patient on Lovenox 1 mg/kg subq q12h (130 mg subq q12h) based on renal function. Lovenox on hold this am for planned thoracentesis. Plan is to continue Lovenox after thoracentesis.   Pharmacy will continue to follow.  Ulice Dash D 10/06/2015,11:57 AM

## 2015-10-06 NOTE — Progress Notes (Signed)
Assessments remain unchanged, Dr Belia HemanKasa has made rounds and updated by me, spouse at bedside has been updated by Dr Belia HemanKasa. Dr Loreta AveWagner  assessed pt for parecentis but unable to perform due to low amount of fluid. Patient continues on the ventilator, oxygen level increased to 50%. Sedation continues as charted, unable to perform wake up assessment due to pt fighting the vent. Versed given for agitation.

## 2015-10-06 NOTE — Progress Notes (Signed)
PULMONARY / CRITICAL CARE MEDICINE   Name: Jonathon Bryant MRN: [id number] DOB: 1965/07/03    ADMISSION DATE:  10/19/2015   PT PROFILE:   50 M with Htn, NASH admitted with fever, acidosis, AMS, severe sepsis, presumed RLL PNA. Intubated in ED. Developed AFRVR in ED  Subjective + febrile. Tylenol and motrin give. Tolerated a bath without dropping O2 saturation. No other issues overnight, now with afib with  RVR, fio2 at 40%, PEEp at 8   MAJOR EVENTS/TEST RESULTS: 04/28 Admitted with diagnoses of severe sepsis, acute respiratory failure, suspected PNA, h/o NASH. Developed AFRVR in ED and suffered NSVT while CVL being placed 04/28 Abdominal US: No ascites seen 04/28 TTE: LVEF 50-55%. LA mildly dilated. Normal PA pressures 04/29 BC positive for pneumococcus 04/29 refractory hypoxemia - vent changed to PC mode 04/30 improved oxygenation. AF persists. Full dose enoxaparin initiated 5/2 afib with rvr-amiodarone started 5/3 ct hest b/l infiltrates effusion, US thoracentesis pending  INDWELLING DEVICES:: ETT 04/28 >>  R IJ CVL 04/28 >>   MICRO DATA: MRSA PCR 04/28 >> NEG Urine 04/28 >> UA negative Resp  04/28 >>  Blood 04/28 >> 2/2 pneumococcus  PCT 04/28 - 04/30: 19.05, 23.35, 17.25  ANTIMICROBIALS:  Vanc 04/28 >> 04/29 Levofloxacin 04/28 >>     VITAL SIGNS: BP 124/71 mmHg  Pulse 119  Temp(Src) 100.3 F (37.9 C) (Oral)  Resp 13  Ht  (1.778 m)  Wt 289 lb 11 oz (131.4 kg)  BMI 41.57 kg/m2  SpO2 94%  HEMODYNAMICS:    VENTILATOR SETTINGS: Vent Mode:  [-] PCV FiO2 (%):  [40 %-50 %] 40 % Set Rate:  [16 bmp] 16 bmp PEEP:  [5 cmH20-8 cmH20] 5 cmH20  INTAKE / OUTPUT: I/O last 3 completed shifts: In: 5964.8 [I.V.:1303.8; NG/GT:2411; IV Piggyback:2250] Out: 3775 [Urine:3775]  PHYSICAL EXAMINATION: General: Obese, RASS -4, intubated and sedated/NMB Neuro: Limited exam due to NMB HEENT: PERRLA, mild scleral edema, ETT, oral mucosa pink Cardiovascular: IRIR in  the low 100s, no MRG  Lungs: basilar crackles and rhonchi, mild expiratory wheezes in right upper lung fields Abdomen: obese, soft, ND, diminished BS Ext: warm, no edema Skin: limited exam, no lesions noted  LABS:  BMET  Recent Labs Lab 10/03/15 0425 10/04/15 0548 10/06/15 0510  NA 139 144 147*  K 4.4 4.4 4.9  CL 102 106 106  CO2 BUN 75* 82* 100*  CREATININE 1.88* 1.82* 2.21*  GLUCOSE 136* 131* 219*    Electrolytes  Recent Labs Lab 10/03/15 0425 10/04/15 0548 10/06/15 0510  CALCIUM 7.7* 8.3* 8.6*    CBC  Recent Labs Lab 10/03/15 0425 10/04/15 0548 10/06/15 0510  WBC 16.3* 16.2* 20.1*  HGB 13.0 12.9* 13.1  HCT 39.0* 37.9* 38.4*  PLT 84* 89* 118*    Coag's  Recent Labs Lab 10/28/2015 1115  APTT 29  INR 1.68    Sepsis Markers  Recent Labs Lab 10/08/2015 1625 09-Oct-2015 1950 Oct 09, 2015 2332 10/02/15 0505 10/03/15 0425 10/04/15 0548  LATICACIDVEN 7.6* 7.3* 5.2*  --   --   --   PROCALCITON  --   --   --  23.35 17.25 12.03    ABG  Recent Labs Lab 10/16/2015 2002 10/28/2015 2211 10/02/15 0646  PHART 7.29* 7.31* 7.20*  PCO2ART 36 44 77*  PO2ART 59* 64* 61*    Liver Enzymes  Recent Labs Lab 10/02/15 0505 10/03/15 0425 10/04/15 0548  AST 65* 113* 133*  ALT 38 44 47  ALKPHOS  39 61 80  BILITOT 2.7* 2.5* 2.9*  ALBUMIN 2.6* 2.5* 2.3*    Cardiac Enzymes  Recent Labs Lab 2015-09-24 1115 10/02/15 0505 10/03/15 0425  TROPONINI 0.05* 0.14* 0.12*    Glucose  Recent Labs Lab 10/04/15 1628 10/04/15 2350 10/05/15 0711 10/05/15 1604 10/06/15 0009 10/06/15 0733  GLUCAP 126* 138* 126* 132* 144* 179*    Ct Chest Wo Contrast  10/05/2015  CLINICAL DATA:  50 year old male inpatient admitted with fever, sepsis and strep pneumonia, intubated requiring high FiO2. EXAM: CT CHEST WITHOUT CONTRAST TECHNIQUE: Multidetector CT imaging of the chest was performed following the standard protocol without IV contrast. COMPARISON:  Chest  radiograph from one day prior. 03/23/2014 chest CT angiogram. FINDINGS: Motion degraded study. Mediastinum/Nodes: Normal heart size. No pericardial fluid/thickening. Left main, left anterior descending, left circumflex and right coronary atherosclerosis. Atherosclerotic nonaneurysmal thoracic aorta. Normal caliber pulmonary arteries. Normal visualized thyroid. Enteric tube terminates in the distal body of the stomach. No appreciable esophageal wall thickening. No axillary adenopathy. Mild right paratracheal, AP window and subcarinal lymphadenopathy, for example a 1.3 cm right paratracheal node (series 2/image 40), a 1.0 cm AP window node (series 2/ image 42) and a 1.2 cm subcarinal node (series 2/image 60), all new since 03/23/2014. No gross hilar adenopathy on this noncontrast study. Lungs/Pleura: Endotracheal tube is well positioned with the tip 3.6 cm above the carina. No pneumothorax. Layering small right pleural effusion. No left pleural effusion. There is extensive patchy consolidation and ground-glass opacity throughout both lungs involving all lung lobes. There is volume loss in the dependent right lower lobe in keeping with mild compressive atelectasis. No significant pulmonary nodules are seen in the aerated portions of the lungs. Upper abdomen: Diffusely irregular liver surface, consistent with cirrhosis. Trace upper abdominal ascites. Partially visualized splenomegaly. Top-normal size gastrohepatic ligament nodes measuring up to 0.9 cm appear stable. Musculoskeletal: No aggressive appearing focal osseous lesions. Mild degenerative changes in the thoracic spine. Symmetric mild gynecomastia. IMPRESSION: 1. Extensive patchy consolidation and ground-glass opacity throughout both lungs involving all lung lobes, most suggestive of severe multifocal pneumonia and/or acute interstitial pneumonia (AIP)/ARDS. 2. Small right pleural effusion. 3. Mild mediastinal lymphadenopathy is nonspecific, probably reactive. 4.  Cirrhosis. Partially visualized splenomegaly. Trace ascites in the upper abdomen. 5. Left main and 3 vessel coronary atherosclerosis. 6. Well-positioned enteric and endotracheal tubes. Electronically Signed   By: Delbert PhenixJason A Poff M.D.   On: 10/05/2015 14:23   Dg Chest Port 1 View  10/06/2015  CLINICAL DATA:  Hypoxia EXAM: PORTABLE CHEST 1 VIEW COMPARISON:  Chest radiograph Oct 04, 2015 and chest CT Oct 05, 2015 FINDINGS: Endotracheal tube tip is 4.4 cm above the carina. Nasogastric tube tip and side port are below the diaphragm. Central catheter tip is in the superior vena cava. No pneumothorax. There is patchy airspace consolidation in the right lower lobe. There is mild left base atelectasis. Note that the extensive airspace consolidation in the left upper lobe seen on CT 1 day prior is not evident on radiographic examination. Heart is mildly enlarged with pulmonary vascularity within normal limits. No adenopathy evident. IMPRESSION: Tube and catheter positions as described without pneumothorax. Persistent right lower lobe region airspace consolidation. Mild left base atelectasis. There is slightly less opacity in the left base compared to 1 day prior. Note that the airspace consolidation in the left upper lobe seen on CT 1 day prior is not appreciable by radiography. Electronically Signed   By: Bretta BangWilliam  Woodruff III M.D.   On: 10/06/2015  07:51   CXR 05/01 PENDING  Discussion 50 yo male with a PMH  HTN, NASH admitted with fever, acidosis, AMS, severe sepsis secondary to strep pneumonia; now intubated and sedated;   ASSESSMENT / PLAN:  PULMONARY A:  Acute hypoxic respiratory failure Severe hypoxemia-still requiring >50% FiO2 RLL CAP - pneumococcal Smoker without h/o COPD Possible para-pneumoic effusion P:   Cont full vent support with PCV mode - settings reviewed and/or adjusted Cont vent bundle Daily SBT if/when meets criteria Daily CXR ABG prn  CARDIOVASCULAR A:  New onset AFRVR - controlled  with metoprolol Transient septic shock, resolved H/O hypertension P:  Monitor rhythm and BP Start amiodarone  Continue full dose LMWH 04/30 for anticoagulation   RENAL A:   AKI, nonoliguric Lactic acidosis - mild, due to severe sepsis P:   Monitor BMET intermittently Monitor I/Os Correct electrolytes as indicated Avoid nephrotoxic medications  GASTROINTESTINAL A:   Obesity NASH P:   SUP: enteral PPI TF protocol initiated 04/28 Monitor LFTs intermittently  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 P:  DVT px: full dose LMWH for AF Monitor CBC intermittently Transfuse per usual guidelines Monitor plt count closely while on LMWH  INFECTIOUS A:   Severe sepsis-persistent fever RLL pneumococcal PNA Possible R para-pneumonic effusion Minimal fluid seen on [place] of chest 04/30 Still with fevers, now with rash, ID consulted P:   Monitor temp, WBC count Micro and abx as above CT chest images reviewed 10/06/2015 Awaiting US guided thoracentesis ID consulted  ENDOCRINE A:   No issues   P:   Monitor CBGs Consider SSI if glu > 180  NEUROLOGIC A:   Acute metabolic encephalopathy  Hyperammonemia  P:   RASS goal: -1, -2 PAD protocol 04/28 - propofol-4/28, PRN fentanyl, PRN midaz and fentanyl; gtt 04/28 Cont Lactulose and monitor ammonia level    I have personally reviewed/obtained a history, examined the patient, evaluated Pertinent laboratory and RadioGraphic/imaging results, and  formulated the assessment and plan   The Patient requires high complexity decision making for assessment and support, frequent evaluation and titration of therapies, application of advanced monitoring technologies and extensive interpretation of multiple databases. Critical Care Time devoted to patient care services described in this note is 40 minutes.    Overall, patient is critically ill, prognosis is guarded.  Patient with Multiorgan failure and at  high risk for cardiac arrest and death.    Lucie Leather, M.D.  [facility]  Medical Director [facility] Medical Director [facility] Cardio-Pulmonary Department

## 2015-10-06 NOTE — Progress Notes (Signed)
[place] INFECTI[facility] PROGRESS NOTE Date of Admission:  10/03/2015     ID: Jonathon Bryant is a 50 y.o. male with Strep PNA bacteremia, multifocal pna, persistent fevers  Active Problems:   Severe sepsis (HCC)   Acute respiratory failure (HCC)   Pneumonia due to Streptococcus pneumoniae (HCC)   A-fib (HCC)   Subjective: Fevers persisted overnight but improving some. US done but minimal fluid so no thoracentesis done   ROS  Unable to obtain   Medications:  Antibiotics Given (last 72 hours)    Date/Time Action Medication Dose Rate   10/04/15 0918 Given   levofloxacin (LEVAQUIN) IVPB 750 mg 750 mg 100 mL/hr   10/04/15 1420 Given   penicillin G potassium 4 Million Units in dextrose 5 % 250 mL IVPB 4 Million Units 250 mL/hr   10/04/15 1643 Given   penicillin G potassium 4 Million Units in dextrose 5 % 250 mL IVPB 4 Million Units 250 mL/hr   10/04/15 1918 Given   penicillin G potassium 4 Million Units in dextrose 5 % 250 mL IVPB 4 Million Units 250 mL/hr   10/04/15 2353 Given   penicillin G potassium 4 Million Units in dextrose 5 % 250 mL IVPB 4 Million Units 250 mL/hr   10/05/15 0421 Given   penicillin G potassium 4 Million Units in dextrose 5 % 250 mL IVPB 4 Million Units 250 mL/hr   10/05/15 0945 Given  [Med unavailable from pharmacy]   penicillin G potassium 4 Million Units in dextrose 5 % 250 mL IVPB 4 Million Units 250 mL/hr   10/05/15 1315 Given   penicillin G potassium 4 Million Units in dextrose 5 % 250 mL IVPB 4 Million Units 250 mL/hr   10/05/15 1806 Given   penicillin G potassium 4 Million Units in dextrose 5 % 250 mL IVPB 4 Million Units 250 mL/hr   10/05/15 1954 Given   penicillin G potassium 4 Million Units in dextrose 5 % 250 mL IVPB 4 Million Units 250 mL/hr   10/06/15 0011 Given   penicillin G potassium 4 Million Units in dextrose 5 % 250 mL IVPB 4 Million Units 250 mL/hr   10/06/15 0409 Given   penicillin G potassium 4 Million Units in dextrose 5  % 250 mL IVPB 4 Million Units 250 mL/hr   10/06/15 0744 Given   penicillin G potassium 4 Million Units in dextrose 5 % 250 mL IVPB 4 Million Units 250 mL/hr   10/06/15 1247 Given   penicillin G potassium 4 Million Units in dextrose 5 % 250 mL IVPB 4 Million Units 250 mL/hr     . antiseptic oral rinse  7 mL Mouth Rinse QID  . aspirin  81 mg Per Tube Daily  . budesonide (PULMICORT) nebulizer solution  0.5 mg Nebulization BID  . chlorhexidine gluconate (SAGE KIT)  15 mL Mouth Rinse BID  . enoxaparin (LOVENOX) injection  1 mg/kg Subcutaneous Q12H  . feeding supplement (PRO-STAT SUGAR FREE 64)  30 mL Per Tube TID  . folic acid  1 mg Per Tube Daily  . free water  100 mL Per Tube Q8H  . ipratropium-albuterol  3 mL Nebulization Q4H  . lactulose  30 g Per Tube TID  . methylPREDNISolone (SOLU-MEDROL) injection  40 mg Intravenous Q12H  . metoprolol tartrate  25 mg Per Tube BID  . pantoprazole sodium  40 mg Per Tube Q1200  . pencillin G potassium IV  4 Million Units Intravenous Q4H  . sodium chloride  flush  10-40 mL Intracatheter Q12H    Objective: Vital signs in last 24 hours: Temp:  [100 F (37.8 C)-102.2 F (39 C)] 100.9 F (38.3 C) (05/03 1400) Pulse Rate:  [46-150] 126 (05/03 1400) Resp:  [13-30] 15 (05/03 1400) BP: (95-169)/(55-94) 122/69 mmHg (05/03 1400) SpO2:  [84 %-97 %] 92 % (05/03 1447) FiO2 (%):  [40 %-50 %] 40 % (05/03 1447) Weight:  [131.4 kg (289 lb 11 oz)] 131.4 kg (289 lb 11 oz) (05/03 0500) Constitutional: obese critically ill appearing. INtubated, OGT in place  HENT: anicteric  Mouth/Throat: Oropharynx ett and OGT inplace Cardiovascular: tachy Pulmonary/Chest: bil rhonchi Abdominal: Soft. Bowel sounds are normal. He exhibits no distension. There is no tenderness.  Lymphadenopathy: He has no cervical adenopathy.  Neurological: sedated  Skin: Skin is warm and dry. No rash noted except for some telengecatasis on arms .  Psychiatric: intubated Ext trace edema  bil GU Foley in place Access R Neck line   Lab Results  Recent Labs  10/04/15 0548 10/06/15 0510  WBC 16.2* 20.1*  HGB 12.9* 13.1  HCT 37.9* 38.4*  NA 144 147*  K 4.4 4.9  CL 106 106  CO2 31 31  BUN 82* 100*  CREATININE 1.82* 2.21*    Microbiology: Results for orders placed or performed during the hospital encounter of 09/24/2015  Blood culture (routine x 2)     Status: Abnormal   Collection Time: 09/25/2015 11:15 AM  Result Value Ref Range Status   Specimen Description BLOOD RIGHT ANTECUBITAL  Final   Special Requests BOTTLES DRAWN AEROBIC AND ANAEROBIC 1CC  Final   Culture  Setup Time   Final    GRAM POSITIVE COCCI IN BOTH AEROBIC AND ANAEROBIC BOTTLES CRITICAL RESULT CALLED TO, READ BACK BY AND VERIFIED WITH: NATE COOKSON AT 0154 10/02/15.PMH CONFIRMED BY RWW    Culture (A)  Final    STREPTOCOCCUS PNEUMONIAE IN BOTH AEROBIC AND ANAEROBIC BOTTLES    Report Status 10/04/2015 FINAL  Final   Organism ID, Bacteria STREPTOCOCCUS PNEUMONIAE  Final      Susceptibility   Streptococcus pneumoniae - MIC*    PENICILLIN Value in next row       <=0.06SENSITIVE    ERYTHROMYCIN Value in next row Sensitive      <=0.06SENSITIVE    LEVOFLOXACIN Value in next row Sensitive      <=0.06SENSITIVE    VANCOMYCIN Value in next row Sensitive      <=0.06SENSITIVE    CEFTRIAXONE Value in next row Sensitive      <=0.06SENSITIVE    * STREPTOCOCCUS PNEUMONIAE  Blood Culture ID Panel (Reflexed)     Status: Abnormal   Collection Time: 09/13/2015 11:15 AM  Result Value Ref Range Status   Enterococcus species NOT DETECTED NOT DETECTED Final   Vancomycin resistance NOT DETECTED NOT DETECTED Final   Listeria monocytogenes NOT DETECTED NOT DETECTED Final   Staphylococcus species NOT DETECTED NOT DETECTED Final   Staphylococcus aureus NOT DETECTED NOT DETECTED Final   Methicillin resistance NOT DETECTED NOT DETECTED Final   Streptococcus species DETECTED (A) NOT DETECTED Final    Comment:  CRITICAL RESULT CALLED TO, READ BACK BY AND VERIFIED WITH: NATE COOKSON AT 0154 10/02/15.PMH    Streptococcus agalactiae NOT DETECTED NOT DETECTED Final   Streptococcus pneumoniae DETECTED (A) NOT DETECTED Final    Comment: CRITICAL RESULT CALLED TO, READ BACK BY AND VERIFIED WITH: NATE COOKSON AT 0154 10/02/15.PMH    Streptococcus pyogenes NOT DETECTED NOT DETECTED Final  Acinetobacter baumannii NOT DETECTED NOT DETECTED Final   Enterobacteriaceae species NOT DETECTED NOT DETECTED Final   Enterobacter cloacae complex NOT DETECTED NOT DETECTED Final   Escherichia coli NOT DETECTED NOT DETECTED Final   Klebsiella oxytoca NOT DETECTED NOT DETECTED Final   Klebsiella pneumoniae NOT DETECTED NOT DETECTED Final   Proteus species NOT DETECTED NOT DETECTED Final   Serratia marcescens NOT DETECTED NOT DETECTED Final   Carbapenem resistance NOT DETECTED NOT DETECTED Final   Haemophilus influenzae NOT DETECTED NOT DETECTED Final   Neisseria meningitidis NOT DETECTED NOT DETECTED Final   Pseudomonas aeruginosa NOT DETECTED NOT DETECTED Final   Candida albicans NOT DETECTED NOT DETECTED Final   Candida glabrata NOT DETECTED NOT DETECTED Final   Candida krusei NOT DETECTED NOT DETECTED Final   Candida parapsilosis NOT DETECTED NOT DETECTED Final   Candida tropicalis NOT DETECTED NOT DETECTED Final  Blood culture (routine x 2)     Status: Abnormal   Collection Time: 09/12/2015 11:54 AM  Result Value Ref Range Status   Specimen Description BLOOD RIGHT FOREARM  Final   Special Requests BOTTLES DRAWN AEROBIC AND ANAEROBIC 2CC  Final   Culture  Setup Time   Final    GRAM POSITIVE COCCI IN BOTH AEROBIC AND ANAEROBIC BOTTLES CRITICAL RESULT CALLED TO, READ BACK BY AND VERIFIED WITH: NATE COOKSON AT 0154 10/02/15.PMH CONFIRMED BY RWW    Culture (A)  Final    STREPTOCOCCUS PNEUMONIAE IN BOTH AEROBIC AND ANAEROBIC BOTTLES    Report Status 10/05/2015 FINAL  Final   Organism ID, Bacteria STREPTOCOCCUS  PNEUMONIAE  Final      Susceptibility   Streptococcus pneumoniae - MIC*    ERYTHROMYCIN <=0.12 SENSITIVE Sensitive     LEVOFLOXACIN 1 SENSITIVE Sensitive     VANCOMYCIN 0.5 SENSITIVE Sensitive     * STREPTOCOCCUS PNEUMONIAE  MRSA PCR Screening     Status: None   Collection Time: 09/19/2015  6:31 PM  Result Value Ref Range Status   MRSA by PCR NEGATIVE NEGATIVE Final    Comment:        The GeneXpert MRSA Assay (FDA approved for NASAL specimens only), is one component of a comprehensive MRSA colonization surveillance program. It is not intended to diagnose MRSA infection nor to guide or monitor treatment for MRSA infections.   Culture, respiratory (tracheal aspirate)     Status: None   Collection Time: 09/05/2015  8:02 PM  Result Value Ref Range Status   Specimen Description TRACHEAL ASPIRATE  Final   Special Requests NONE  Final   Gram Stain MANY WBC SEEN RARE GRAM POSITIVE COCCI   Final   Culture NO GROWTH 3 DAYS  Final   Report Status 10/04/2015 FINAL  Final   \ Studies/Results: Ct Chest Wo Contrast  10/05/2015  CLINICAL DATA:  50 year old male inpatient admitted with fever, sepsis and strep pneumonia, intubated requiring high FiO2. EXAM: CT CHEST WITHOUT CONTRAST TECHNIQUE: Multidetector CT imaging of the chest was performed following the standard protocol without IV contrast. COMPARISON:  Chest radiograph from one day prior. 03/23/2014 chest CT angiogram. FINDINGS: Motion degraded study. Mediastinum/Nodes: Normal heart size. No pericardial fluid/thickening. Left main, left anterior descending, left circumflex and right coronary atherosclerosis. Atherosclerotic nonaneurysmal thoracic aorta. Normal caliber pulmonary arteries. Normal visualized thyroid. Enteric tube terminates in the distal body of the stomach. No appreciable esophageal wall thickening. No axillary adenopathy. Mild right paratracheal, AP window and subcarinal lymphadenopathy, for example a 1.3 cm right paratracheal  node (series 2/image 40),  a 1.0 cm AP window node (series 2/ image 42) and a 1.2 cm subcarinal node (series 2/image 60), all new since 03/23/2014. No gross hilar adenopathy on this noncontrast study. Lungs/Pleura: Endotracheal tube is well positioned with the tip 3.6 cm above the carina. No pneumothorax. Layering small right pleural effusion. No left pleural effusion. There is extensive patchy consolidation and ground-glass opacity throughout both lungs involving all lung lobes. There is volume loss in the dependent right lower lobe in keeping with mild compressive atelectasis. No significant pulmonary nodules are seen in the aerated portions of the lungs. Upper abdomen: Diffusely irregular liver surface, consistent with cirrhosis. Trace upper abdominal ascites. Partially visualized splenomegaly. Top-normal size gastrohepatic ligament nodes measuring up to 0.9 cm appear stable. Musculoskeletal: No aggressive appearing focal osseous lesions. Mild degenerative changes in the thoracic spine. Symmetric mild gynecomastia. IMPRESSION: 1. Extensive patchy consolidation and ground-glass opacity throughout both lungs involving all lung lobes, most suggestive of severe multifocal pneumonia and/or acute interstitial pneumonia (AIP)/ARDS. 2. Small right pleural effusion. 3. Mild mediastinal lymphadenopathy is nonspecific, probably reactive. 4. Cirrhosis. Partially visualized splenomegaly. Trace ascites in the upper abdomen. 5. Left main and 3 vessel coronary atherosclerosis. 6. Well-positioned enteric and endotracheal tubes. Electronically Signed   By: Ilona Sorrel M.D.   On: 10/05/2015 14:23   [place] Chest  10/06/2015  CLINICAL DATA:  50 year old male with a history of small pleural effusion on CT and chest x-ray, with diagnosis of pneumococcal pneumonia. He has been referred for potential thoracentesis. Patient is ICU status on a ventilator. EXAM: CHEST ULTRASOUND COMPARISON:  CT 10/05/2015, chest x-ray 10/06/2015 FINDINGS:  Ultrasound survey of the right chest for possible thoracentesis demonstrates small pleural fluid without complex features. Ultrasound-guided thoracentesis was deferred at this time given the small size. IMPRESSION: Small, layering right pleural effusion, likely parapneumonic. Signed, Dulcy Fanny. Earleen Newport, DO [facility] Radiology Electronically Signed   By: Corrie Mckusick D.O.   On: 10/06/2015 12:31   Dg Chest Port 1 View  10/06/2015  CLINICAL DATA:  Hypoxia EXAM: PORTABLE CHEST 1 VIEW COMPARISON:  Chest radiograph Oct 04, 2015 and chest CT Oct 05, 2015 FINDINGS: Endotracheal tube tip is 4.4 cm above the carina. Nasogastric tube tip and side port are below the diaphragm. Central catheter tip is in the superior vena cava. No pneumothorax. There is patchy airspace consolidation in the right lower lobe. There is mild left base atelectasis. Note that the extensive airspace consolidation in the left upper lobe seen on CT 1 day prior is not evident on radiographic examination. Heart is mildly enlarged with pulmonary vascularity within normal limits. No adenopathy evident. IMPRESSION: Tube and catheter positions as described without pneumothorax. Persistent right lower lobe region airspace consolidation. Mild left base atelectasis. There is slightly less opacity in the left base compared to 1 day prior. Note that the airspace consolidation in the left upper lobe seen on CT 1 day prior is not appreciable by radiography. Electronically Signed   By: Lowella Grip III M.D.   On: 10/06/2015 07:51    Assessment/Plan: Jonathon Bryant is a 50 y.o. male with Strep PNA bacteremia, R lower lobe infiltrate with persistent fevers and leukocytosis, and an evanescent rash. ECHO neg for veg.  I am concerned that he has an empyema. USS 5/3 per radiology did not show enough effusion to tap.   Recommendations Agree with IV PCN Check repeat bcx  Thank you very much for the consult.  Will follow with you.  [place], Jonathon Bryant   10/06/2015, 2:56 PM

## 2015-10-06 NOTE — Progress Notes (Signed)
eLink Physician-Brief Progress Note Patient Name: Jonathon MusicStephen E Hiscox DOB: 03/09/1966 MRN: [id number]   Date of Service  10/06/2015  HPI/Events of Note  Persistent fever to 102.4 F in spite of motrin 800 mg Q 8 hours and Ice packs. Pneumococcal pneumonia on PCN G 4 million units Q 4 hours.  eICU Interventions  Will order: 1. Cooling blanket PRN.     Intervention Category Major Interventions: Infection - evaluation and management  Sommer,Steven Eugene 10/06/2015, 9:40 PM

## 2015-10-07 ENCOUNTER — Inpatient Hospital Stay: Payer: BLUE CROSS/BLUE SHIELD

## 2015-10-07 DIAGNOSIS — N179 Acute kidney failure, unspecified: Secondary | ICD-10-CM

## 2015-10-07 LAB — BLOOD GAS, ARTERIAL
ACID-BASE EXCESS: 4.6 mmol/L — AB (ref 0.0–3.0)
ALLENS TEST (PASS/FAIL): POSITIVE — AB
Acid-Base Excess: 4.6 mmol/L — ABNORMAL HIGH (ref 0.0–3.0)
Allens test (pass/fail): POSITIVE — AB
BICARBONATE: 35.6 meq/L — AB (ref 21.0–28.0)
BICARBONATE: 35.1 meq/L — AB (ref 21.0–28.0)
FIO2: 0.6
FIO2: 1
LHR: 26 {breaths}/min
MECHVT: 500 mL
O2 SAT: 86.8 %
O2 Saturation: 98 %
PATIENT TEMPERATURE: 37
PATIENT TEMPERATURE: 37
PCO2 ART: 83 mmHg — AB (ref 32.0–48.0)
PEEP/CPAP: 5 cmH2O
PEEP/CPAP: 5 cmH2O
PH ART: 7.24 — AB (ref 7.350–7.450)
PO2 ART: 62 mmHg — AB (ref 83.0–108.0)
RATE: 16 resp/min
VT: 500 mL
pCO2 arterial: 82 mmHg (ref 32.0–48.0)
pH, Arterial: 7.24 — ABNORMAL LOW (ref 7.350–7.450)
pO2, Arterial: 120 mmHg — ABNORMAL HIGH (ref 83.0–108.0)

## 2015-10-07 LAB — BASIC METABOLIC PANEL WITH GFR
Anion gap: 14 (ref 5–15)
BUN: 115 mg/dL — ABNORMAL HIGH (ref 6–20)
CO2: 29 mmol/L (ref 22–32)
Calcium: 8.4 mg/dL — ABNORMAL LOW (ref 8.9–10.3)
Chloride: 101 mmol/L (ref 101–111)
Creatinine, Ser: 2.17 mg/dL — ABNORMAL HIGH (ref 0.61–1.24)
GFR calc Af Amer: 38 mL/min — ABNORMAL LOW
GFR calc non Af Amer: 33 mL/min — ABNORMAL LOW
Glucose, Bld: 212 mg/dL — ABNORMAL HIGH (ref 65–99)
Potassium: 3.9 mmol/L (ref 3.5–5.1)
Sodium: 144 mmol/L (ref 135–145)

## 2015-10-07 LAB — RENAL FUNCTION PANEL
ANION GAP: 12 (ref 5–15)
ANION GAP: 13 (ref 5–15)
Albumin: 2 g/dL — ABNORMAL LOW (ref 3.5–5.0)
Albumin: 1.8 g/dL — ABNORMAL LOW (ref 3.5–5.0)
BUN: 129 mg/dL — AB (ref 6–20)
BUN: 123 mg/dL — ABNORMAL HIGH (ref 6–20)
CHLORIDE: 103 mmol/L (ref 101–111)
CO2: 32 mmol/L (ref 22–32)
CO2: 28 mmol/L (ref 22–32)
CREATININE: 2.52 mg/dL — AB (ref 0.61–1.24)
Calcium: 8.2 mg/dL — ABNORMAL LOW (ref 8.9–10.3)
Calcium: 7.8 mg/dL — ABNORMAL LOW (ref 8.9–10.3)
Chloride: 104 mmol/L (ref 101–111)
Creatinine, Ser: 2.74 mg/dL — ABNORMAL HIGH (ref 0.61–1.24)
GFR calc Af Amer: 29 mL/min — ABNORMAL LOW (ref >60)
GFR calc non Af Amer: 25 mL/min — ABNORMAL LOW (ref >60)
GFR, EST AFRICAN AMERICAN: 32 mL/min — AB (ref >60)
GFR, EST NON AFRICAN AMERICAN: 28 mL/min — AB (ref >60)
GLUCOSE: 227 mg/dL — AB (ref 65–99)
Glucose, Bld: 212 mg/dL — ABNORMAL HIGH (ref 65–99)
POTASSIUM: 5.1 mmol/L (ref 3.5–5.1)
Phosphorus: 9 mg/dL — ABNORMAL HIGH (ref 2.5–4.6)
Phosphorus: 8 mg/dL — ABNORMAL HIGH (ref 2.5–4.6)
Potassium: 5.5 mmol/L — ABNORMAL HIGH (ref 3.5–5.1)
SODIUM: 145 mmol/L (ref 135–145)
Sodium: 147 mmol/L — ABNORMAL HIGH (ref 135–145)

## 2015-10-07 LAB — CBC
HCT: 39 % — ABNORMAL LOW (ref 40.0–52.0)
Hemoglobin: 13.1 g/dL (ref 13.0–18.0)
MCH: 34.2 pg — ABNORMAL HIGH (ref 26.0–34.0)
MCHC: 33.7 g/dL (ref 32.0–36.0)
MCV: 101.7 fL — AB (ref 80.0–100.0)
PLATELETS: 129 10*3/uL — AB (ref 150–440)
RBC: 3.84 MIL/uL — AB (ref 4.40–5.90)
RDW: 15.7 % — AB (ref 11.5–14.5)
WBC: 26 10*3/uL — AB (ref 3.8–10.6)

## 2015-10-07 LAB — EXPECTORATED SPUTUM ASSESSMENT W GRAM STAIN, RFLX TO RESP C

## 2015-10-07 LAB — GLUCOSE, CAPILLARY
Glucose-Capillary: 204 mg/dL — ABNORMAL HIGH (ref 65–99)
Glucose-Capillary: 201 mg/dL — ABNORMAL HIGH (ref 65–99)
Glucose-Capillary: 210 mg/dL — ABNORMAL HIGH (ref 65–99)

## 2015-10-07 LAB — EXPECTORATED SPUTUM ASSESSMENT W REFEX TO RESP CULTURE: SPECIAL REQUESTS: NORMAL

## 2015-10-07 LAB — PHOSPHORUS: Phosphorus: 4.2 mg/dL (ref 2.5–4.6)

## 2015-10-07 LAB — TRIGLYCERIDES: TRIGLYCERIDES: 168 mg/dL — AB (ref <150)

## 2015-10-07 LAB — MAGNESIUM: MAGNESIUM: 2.7 mg/dL — AB (ref 1.7–2.4)

## 2015-10-07 LAB — RAPID HIV SCREEN (HIV 1/2 AB+AG)
HIV 1/2 ANTIBODIES: NONREACTIVE
HIV-1 P24 Antigen - HIV24: NONREACTIVE

## 2015-10-07 MED ORDER — HEPARIN (PORCINE) IN NACL 100-0.45 UNIT/ML-% IJ SOLN
1650.0000 [IU]/h | INTRAMUSCULAR | Status: DC
  Administered 2015-10-07: 1550 [IU]/h via INTRAVENOUS
  Filled 2015-10-07 (×6): qty 250

## 2015-10-07 MED ORDER — MIDAZOLAM HCL 5 MG/ML IJ SOLN
0.0000 mg/h | INTRAMUSCULAR | Status: DC
  Filled 2015-10-07: qty 10

## 2015-10-07 MED ORDER — VECURONIUM BROMIDE 10 MG IV SOLR
10.0000 mg | Freq: Once | INTRAVENOUS | Status: AC
  Administered 2015-10-07: 10 mg via INTRAVENOUS
  Filled 2015-10-07: qty 10

## 2015-10-07 MED ORDER — SODIUM CHLORIDE 0.9 % IV SOLN
INTRAVENOUS | Status: DC
  Administered 2015-10-07 – 2015-10-08 (×2): via INTRAVENOUS

## 2015-10-07 MED ORDER — MIDAZOLAM BOLUS VIA INFUSION
1.0000 mg | INTRAVENOUS | Status: DC | PRN
  Filled 2015-10-07: qty 2

## 2015-10-07 MED ORDER — SODIUM CHLORIDE 0.9 % IV SOLN
2.0000 mg/h | INTRAVENOUS | Status: DC
  Administered 2015-10-07 – 2015-10-09 (×3): 2 mg/h via INTRAVENOUS
  Filled 2015-10-07 (×3): qty 5

## 2015-10-07 MED ORDER — PROPOFOL 1000 MG/100ML IV EMUL
INTRAVENOUS | Status: AC
  Administered 2015-10-07: 10 mg via INTRAVENOUS
  Filled 2015-10-07: qty 100

## 2015-10-07 MED ORDER — DIATRIZOATE MEGLUMINE & SODIUM 66-10 % PO SOLN
15.0000 mL | ORAL | Status: AC
  Administered 2015-10-07 (×2): 15 mL via ORAL

## 2015-10-07 MED ORDER — PROPOFOL 1000 MG/100ML IV EMUL
0.0000 ug/kg/min | INTRAVENOUS | Status: DC
  Administered 2015-10-07: 40 ug/kg/min via INTRAVENOUS
  Administered 2015-10-07: 10 mg via INTRAVENOUS
  Filled 2015-10-07 (×3): qty 100

## 2015-10-07 MED ORDER — PIPERACILLIN-TAZOBACTAM 4.5 G IVPB
4.5000 g | Freq: Three times a day (TID) | INTRAVENOUS | Status: DC
  Administered 2015-10-07 – 2015-10-09 (×6): 4.5 g via INTRAVENOUS
  Filled 2015-10-07 (×8): qty 100

## 2015-10-07 MED ORDER — PUREFLOW DIALYSIS SOLUTION
INTRAVENOUS | Status: DC
  Administered 2015-10-07: 3 via INTRAVENOUS_CENTRAL

## 2015-10-07 MED ORDER — VANCOMYCIN HCL IN DEXTROSE 1-5 GM/200ML-% IV SOLN
1000.0000 mg | INTRAVENOUS | Status: DC
  Administered 2015-10-08: 1000 mg via INTRAVENOUS
  Filled 2015-10-07 (×2): qty 200

## 2015-10-07 MED ORDER — HEPARIN SODIUM (PORCINE) 1000 UNIT/ML DIALYSIS
1000.0000 [IU] | INTRAMUSCULAR | Status: DC | PRN
Start: 2015-10-07 — End: 2015-10-09
  Filled 2015-10-07: qty 6

## 2015-10-07 MED ORDER — SODIUM CHLORIDE 0.9 % IV BOLUS (SEPSIS)
1000.0000 mL | Freq: Once | INTRAVENOUS | Status: AC
  Administered 2015-10-07: 1000 mL via INTRAVENOUS

## 2015-10-07 MED ORDER — VECURONIUM BROMIDE 10 MG IV SOLR
INTRAVENOUS | Status: AC
  Filled 2015-10-07: qty 10

## 2015-10-07 MED ORDER — DILTIAZEM HCL 25 MG/5ML IV SOLN
10.0000 mg | Freq: Once | INTRAVENOUS | Status: AC
  Administered 2015-10-07: 10 mg via INTRAVENOUS
  Filled 2015-10-07: qty 5

## 2015-10-07 MED ORDER — PHENYLEPHRINE HCL 10 MG/ML IJ SOLN
30.0000 ug/min | INTRAVENOUS | Status: DC
  Administered 2015-10-08: 100 ug/min via INTRAVENOUS
  Administered 2015-10-08: 30 ug/min via INTRAVENOUS
  Administered 2015-10-08: 36 ug/min via INTRAVENOUS
  Filled 2015-10-07 (×4): qty 1

## 2015-10-07 MED ORDER — INSULIN ASPART 100 UNIT/ML ~~LOC~~ SOLN
2.0000 [IU] | SUBCUTANEOUS | Status: DC
  Administered 2015-10-07 (×2): 6 [IU] via SUBCUTANEOUS
  Administered 2015-10-08: 4 [IU] via SUBCUTANEOUS
  Administered 2015-10-08: 2 [IU] via SUBCUTANEOUS
  Filled 2015-10-07: qty 6
  Filled 2015-10-07: qty 4
  Filled 2015-10-07: qty 2
  Filled 2015-10-07: qty 6

## 2015-10-07 MED ORDER — VANCOMYCIN HCL IN DEXTROSE 1-5 GM/200ML-% IV SOLN
1000.0000 mg | Freq: Two times a day (BID) | INTRAVENOUS | Status: DC
  Filled 2015-10-07: qty 200

## 2015-10-07 MED ORDER — VECURONIUM BROMIDE 10 MG IV SOLR
10.0000 mg | Freq: Once | INTRAVENOUS | Status: AC
  Administered 2015-10-07: 10 mg via INTRAVENOUS

## 2015-10-07 MED ORDER — VANCOMYCIN HCL IN DEXTROSE 1-5 GM/200ML-% IV SOLN
1000.0000 mg | INTRAVENOUS | Status: AC
  Administered 2015-10-07: 1000 mg via INTRAVENOUS
  Filled 2015-10-07: qty 200

## 2015-10-07 MED ORDER — HEPARIN 1000 UNIT/ML FOR PERITONEAL DIALYSIS
1000.0000 [IU] | INTRAMUSCULAR | Status: DC | PRN
  Filled 2015-10-07: qty 1

## 2015-10-07 MED ORDER — PUREFLOW DIALYSIS SOLUTION
INTRAVENOUS | Status: DC
  Administered 2015-10-07 – 2015-10-08 (×3): 3 via INTRAVENOUS_CENTRAL
  Administered 2015-10-09: 01:00:00 via INTRAVENOUS_CENTRAL

## 2015-10-07 NOTE — Procedures (Signed)
Central Venous Dailysis Catheter Placement: Indication: Hemo Dialysis/CRRT   Consent:verbal/written  Risks and benefits explained in detail including risk of infection, bleeding, respiratory failure and death..   Hand washing performed prior to starting the procedure.   Procedure: An active timeout was performed and correct patient, name, & ID confirmed.  After explaining risk and benefits, patient was positioned correctly for central venous access. Patient was prepped using strict sterile technique including chlorohexadine preps, sterile drape, sterile gown and sterile gloves.  The area was prepped, draped and anesthetized in the usual sterile manner. Patient comfort was obtained.  A triple lumen catheter was placed in RT IJ Vein There was good blood return, catheter caps were placed on lumens, catheter flushed easily, the line was secured and a sterile dressing and BIO-PATCH applied.   Ultrasound was used to visualize vasculature and guidance of needle.   Number of Attempts: 1 Complications:none  Estimated Blood Loss: none Chest Radiograph indicated and ordered.  Operator: Bellamy Rubey.   Alizzon Dioguardi David Babe Anthis, M.D.  [facility]  Medical Director ICU-[facility]ate Medical Director [facility] Cardio-Pulmonary Department     

## 2015-10-07 NOTE — Progress Notes (Signed)
ANTICOAGULATION CONSULT NOTE - Initial Consult  Pharmacy Consult for Lovenox Indication: atrial fibrillation  No Known Allergies  Patient Measurements: Height: 5' 10"  (177.8 cm) Weight: 293 lb 3.4 oz (133 kg) IBW/kg (Calculated) : 73   Vital Signs: Temp: 100.2 F (37.9 C) (05/04 1000) Temp Source: Core (Comment) (05/04 0700) BP: 114/90 mmHg (05/04 1023) Pulse Rate: 141 (05/04 1023)  Labs:  Recent Labs  10/06/15 0510 10/07/15 0753  HGB 13.1 13.1  HCT 38.4* 39.0*  PLT 118* 129*  CREATININE 2.21* 2.17*    Estimated Creatinine Clearance: 54.6 mL/min (by C-G formula based on Cr of 2.17).   Medical History: Past Medical History  Diagnosis Date  . Hypertension   . Cirrhosis ([place])     Medications:  Scheduled:  . antiseptic oral rinse  7 mL Mouth Rinse QID  . aspirin  81 mg Per Tube Daily  . budesonide (PULMICORT) nebulizer solution  0.5 mg Nebulization BID  . chlorhexidine gluconate (SAGE KIT)  15 mL Mouth Rinse BID  . diatrizoate meglumine-sodium  15 mL Oral Q1 Hr x 2  . enoxaparin (LOVENOX) injection  1 mg/kg Subcutaneous Q12H  . feeding supplement (PRO-STAT SUGAR FREE 64)  30 mL Per Tube TID  . folic acid  1 mg Per Tube Daily  . free water  100 mL Per Tube Q8H  . ipratropium-albuterol  3 mL Nebulization Q4H  . lactulose  30 g Per Tube TID  . methylPREDNISolone (SOLU-MEDROL) injection  40 mg Intravenous Q12H  . metoprolol tartrate  25 mg Per Tube BID  . pantoprazole sodium  40 mg Per Tube Q1200  . pencillin G potassium IV  4 Million Units Intravenous Q4H  . sodium chloride flush  10-40 mL Intracatheter Q12H   Infusions:  . sodium chloride 75 mL/hr at 10/07/15 1023  . amiodarone 30 mg/hr (10/07/15 0049)  . feeding supplement (VITAL HIGH PROTEIN) 1,000 mL (10/07/15 1045)  . fentaNYL infusion INTRAVENOUS 200 mcg/hr (10/07/15 1105)    Assessment: Pharmacy consulted to dose Lovenox in a 50 yo male with atrial fibrillation.   Goal of Therapy:  Monitor SCr  and CBC per policy  Plan:  Will continue patient on Lovenox 1 mg/kg subq q12h (130 mg subq q12h) based on renal function.   Pharmacy will continue to follow.  Ulice Dash D 10/07/2015,12:29 PM

## 2015-10-07 NOTE — Progress Notes (Signed)
[place] NOTE  Pharmacy Consult for Hyperglycemia monitoring   No Known Allergies  Patient Measurements: Height: _0  (177.8 cm) Weight: 293 lb 3.4 oz (133 kg) IBW/kg (Calculated) : 73  Vital Signs: Temp: 100.2 F (37.9 C) (05/04 1000) Temp Source: Core (Comment) (05/04 0700) BP: 114/90 mmHg (05/04 1023) Pulse Rate: 141 (05/04 1023) Intake/Output from previous day: 05/03 0701 - 05/04 0700 In: 4068.8 [I.V.:1120.8; VZ/DG:3875; IV Piggyback:1500] Out: 2575 [Urine:2575] Intake/Output from this shift: Total I/O In: 543.9 [I.V.:113.9; NG/GT:180; IV Piggyback:250] Out: 1225 [Urine:1225] Vent settings for last 24 hours: Vent Mode:  [-] PRVC FiO2 (%):  [40 %-70 %] 70 % Set Rate:  [16 bmp-26 bmp] 26 bmp Vt Set:  [16 mL-500 mL] 500 mL PEEP:  [5 cmH20] 5 cmH20  Labs:  Recent Labs  10/06/15 0510 10/07/15 0753  WBC 20.1* 26.0*  HGB 13.1 13.1  HCT 38.4* 39.0*  PLT 118* 129*  CREATININE 2.21* 2.17*  MG  --  2.7*  PHOS  --  4.2   Estimated Creatinine Clearance: 54.6 mL/min (by C-G formula based on Cr of 2.17).   Recent Labs  10/06/15 1546 10/06/15 2325 10/07/15 0727  GLUCAP 172* 180* 204*    Microbiology: Recent Results (from the past 720 hour(s))  Blood culture (routine x 2)     Status: Abnormal   Collection Time: 09/14/2015 11:15 AM  Result Value Ref Range Status   Specimen Description BLOOD RIGHT ANTECUBITAL  Final   Special Requests BOTTLES DRAWN AEROBIC AND ANAEROBIC 1CC  Final   Culture  Setup Time   Final    GRAM POSITIVE COCCI IN BOTH AEROBIC AND ANAEROBIC BOTTLES CRITICAL RESULT CALLED TO, READ BACK BY AND VERIFIED WITH: NATE COOKSON AT 0154 10/02/15.PMH CONFIRMED BY RWW    Culture (A)  Final    STREPTOCOCCUS PNEUMONIAE IN BOTH AEROBIC AND ANAEROBIC BOTTLES    Report Status 10/04/2015 FINAL  Final   Organism ID, Bacteria STREPTOCOCCUS PNEUMONIAE  Final      Susceptibility   Streptococcus pneumoniae - MIC*    PENICILLIN  Value in next row       <=0.06SENSITIVE    ERYTHROMYCIN Value in next row Sensitive      <=0.06SENSITIVE    LEVOFLOXACIN Value in next row Sensitive      <=0.06SENSITIVE    VANCOMYCIN Value in next row Sensitive      <=0.06SENSITIVE    CEFTRIAXONE Value in next row Sensitive      <=0.06SENSITIVE    * STREPTOCOCCUS PNEUMONIAE  Blood Culture ID Panel (Reflexed)     Status: Abnormal   Collection Time: 09/20/2015 11:15 AM  Result Value Ref Range Status   Enterococcus species NOT DETECTED NOT DETECTED Final   Vancomycin resistance NOT DETECTED NOT DETECTED Final   Listeria monocytogenes NOT DETECTED NOT DETECTED Final   Staphylococcus species NOT DETECTED NOT DETECTED Final   Staphylococcus aureus NOT DETECTED NOT DETECTED Final   Methicillin resistance NOT DETECTED NOT DETECTED Final   Streptococcus species DETECTED (A) NOT DETECTED Final    Comment: CRITICAL RESULT CALLED TO, READ BACK BY AND VERIFIED WITH: NATE COOKSON AT 0154 10/02/15.PMH    Streptococcus agalactiae NOT DETECTED NOT DETECTED Final   Streptococcus pneumoniae DETECTED (A) NOT DETECTED Final    Comment: CRITICAL RESULT CALLED TO, READ BACK BY AND VERIFIED WITH: NATE COOKSON AT 0154 10/02/15.PMH    Streptococcus pyogenes NOT DETECTED NOT DETECTED Final   Acinetobacter baumannii NOT DETECTED NOT DETECTED Final  Enterobacteriaceae species NOT DETECTED NOT DETECTED Final   Enterobacter cloacae complex NOT DETECTED NOT DETECTED Final   Escherichia coli NOT DETECTED NOT DETECTED Final   Klebsiella oxytoca NOT DETECTED NOT DETECTED Final   Klebsiella pneumoniae NOT DETECTED NOT DETECTED Final   Proteus species NOT DETECTED NOT DETECTED Final   Serratia marcescens NOT DETECTED NOT DETECTED Final   Carbapenem resistance NOT DETECTED NOT DETECTED Final   Haemophilus influenzae NOT DETECTED NOT DETECTED Final   Neisseria meningitidis NOT DETECTED NOT DETECTED Final   Pseudomonas aeruginosa NOT DETECTED NOT DETECTED Final    Candida albicans NOT DETECTED NOT DETECTED Final   Candida glabrata NOT DETECTED NOT DETECTED Final   Candida krusei NOT DETECTED NOT DETECTED Final   Candida parapsilosis NOT DETECTED NOT DETECTED Final   Candida tropicalis NOT DETECTED NOT DETECTED Final  Blood culture (routine x 2)     Status: Abnormal   Collection Time: 09/26/2015 11:54 AM  Result Value Ref Range Status   Specimen Description BLOOD RIGHT FOREARM  Final   Special Requests BOTTLES DRAWN AEROBIC AND ANAEROBIC 2CC  Final   Culture  Setup Time   Final    GRAM POSITIVE COCCI IN BOTH AEROBIC AND ANAEROBIC BOTTLES CRITICAL RESULT CALLED TO, READ BACK BY AND VERIFIED WITH: NATE COOKSON AT [place] 10/02/15.PMH CONFIRMED BY RWW    Culture (A)  Final    STREPTOCOCCUS PNEUMONIAE IN BOTH AEROBIC AND ANAEROBIC BOTTLES    Report Status 10/05/2015 FINAL  Final   Organism ID, Bacteria STREPTOCOCCUS PNEUMONIAE  Final      Susceptibility   Streptococcus pneumoniae - MIC*    ERYTHROMYCIN <=0.12 SENSITIVE Sensitive     LEVOFLOXACIN 1 SENSITIVE Sensitive     VANCOMYCIN 0.5 SENSITIVE Sensitive     * STREPTOCOCCUS PNEUMONIAE  MRSA PCR Screening     Status: None   Collection Time: 09/17/2015  6:31 PM  Result Value Ref Range Status   MRSA by PCR NEGATIVE NEGATIVE Final    Comment:        The GeneXpert MRSA Assay (FDA approved for NASAL specimens only), is one component of a comprehensive MRSA colonization surveillance program. It is not intended to diagnose MRSA infection nor to guide or monitor treatment for MRSA infections.   Culture, respiratory (tracheal aspirate)     Status: None   Collection Time: 10/02/2015  8:02 PM  Result Value Ref Range Status   Specimen Description TRACHEAL ASPIRATE  Final   Special Requests NONE  Final   Gram Stain MANY WBC SEEN RARE GRAM POSITIVE COCCI   Final   Culture NO GROWTH 3 DAYS  Final   Report Status 10/04/2015 FINAL  Final  CULTURE, BLOOD (ROUTINE X 2) w Reflex to PCR ID Panel     Status:  None (Preliminary result)   Collection Time: 10/06/15  3:16 PM  Result Value Ref Range Status   Specimen Description BLOOD LEFT ASSIST CONTROL  Final   Special Requests   Final    BOTTLES DRAWN AEROBIC AND ANAEROBIC  AERO 10CC ANA 5CC   Culture NO GROWTH < 24 HOURS  Final   Report Status PENDING  Incomplete  CULTURE, BLOOD (ROUTINE X 2) w Reflex to PCR ID Panel     Status: None (Preliminary result)   Collection Time: 10/06/15  3:37 PM  Result Value Ref Range Status   Specimen Description BLOOD RIGHT ASSIST CONTROL  Final   Special Requests   Final    BOTTLES DRAWN AEROBIC AND ANAEROBIC  AERO 10CC ANA 15CC   Culture NO GROWTH < 24 HOURS  Final   Report Status PENDING  Incomplete    Medications:  Scheduled:  . antiseptic oral rinse  7 mL Mouth Rinse QID  . aspirin  81 mg Per Tube Daily  . budesonide (PULMICORT) nebulizer solution  0.5 mg Nebulization BID  . chlorhexidine gluconate (SAGE KIT)  15 mL Mouth Rinse BID  . enoxaparin (LOVENOX) injection  1 mg/kg Subcutaneous Q12H  . feeding supplement (PRO-STAT SUGAR FREE 64)  30 mL Per Tube TID  . folic acid  1 mg Per Tube Daily  . free water  100 mL Per Tube Q8H  . insulin aspart  2-6 Units Subcutaneous Q4H  . ipratropium-albuterol  3 mL Nebulization Q4H  . lactulose  30 g Per Tube TID  . methylPREDNISolone (SOLU-MEDROL) injection  40 mg Intravenous Q12H  . metoprolol tartrate  25 mg Per Tube BID  . pantoprazole sodium  40 mg Per Tube Q1200  . piperacillin-tazobactam  4.5 g Intravenous Q8H  . sodium chloride flush  10-40 mL Intracatheter Q12H  . vancomycin  1,000 mg Intravenous Q12H  . vecuronium      . vecuronium  10 mg Intravenous Once   Infusions:  . sodium chloride 75 mL/hr at 10/07/15 1023  . amiodarone 30 mg/hr (10/07/15 1403)  . feeding supplement (VITAL HIGH PROTEIN) Stopped (10/07/15 1303)  . HYDROmorphone 4 mg/hr (10/07/15 1535)  . midazolam (VERSED) infusion    . propofol (DIPRIVAN) infusion 50 mcg/kg/min (10/07/15  1536)    Assessment: Pharmacy consulted to assist in hyperglycemia management in this 50 y/o M with sepsis.   Plan:  Patient ordered SSI q 4 hours. Will f/u AM.   Ulice Dash D 10/07/2015,3:19 PM

## 2015-10-07 NOTE — Progress Notes (Signed)
[place] NOTE  Pharmacy Consult for CRRT medication adjustment    No Known Allergies  Patient Measurements: Height: 5' 10"  (177.8 cm) Weight: 293 lb 3.4 oz (133 kg) IBW/kg (Calculated) : 73   Vital Signs: Temp: 100.2 F (37.9 C) (05/04 1000) Temp Source: Core (Comment) (05/04 0700) BP: 114/90 mmHg (05/04 1023) Pulse Rate: 141 (05/04 1023) Intake/Output from previous day: 05/03 0701 - 05/04 0700 In: 4068.8 [I.V.:1120.8; PI/RJ:1884; IV Piggyback:1500] Out: 2575 [Urine:2575] Intake/Output from this shift: Total I/O In: 543.9 [I.V.:113.9; NG/GT:180; IV Piggyback:250] Out: 1225 [Urine:1225] Vent settings for last 24 hours: Vent Mode:  [-] PRVC FiO2 (%):  [40 %-100 %] 100 % Set Rate:  [16 bmp-26 bmp] 26 bmp Vt Set:  [16 mL-500 mL] 500 mL PEEP:  [5 cmH20] 5 cmH20  Labs:  Recent Labs  10/06/15 0510 10/07/15 0753  WBC 20.1* 26.0*  HGB 13.1 13.1  HCT 38.4* 39.0*  PLT 118* 129*  CREATININE 2.21* 2.17*  MG  --  2.7*  PHOS  --  4.2   Estimated Creatinine Clearance: 54.6 mL/min (by C-G formula based on Cr of 2.17).   Recent Labs  10/06/15 2325 10/07/15 0727 10/07/15 1608  GLUCAP 180* 204* 201*    Microbiology: Recent Results (from the past 720 hour(s))  Blood culture (routine x 2)     Status: Abnormal   Collection Time: 09/26/2015 11:15 AM  Result Value Ref Range Status   Specimen Description BLOOD RIGHT ANTECUBITAL  Final   Special Requests BOTTLES DRAWN AEROBIC AND ANAEROBIC 1CC  Final   Culture  Setup Time   Final    GRAM POSITIVE COCCI IN BOTH AEROBIC AND ANAEROBIC BOTTLES CRITICAL RESULT CALLED TO, READ BACK BY AND VERIFIED WITH: NATE COOKSON AT 0154 10/02/15.PMH CONFIRMED BY RWW    Culture (A)  Final    STREPTOCOCCUS PNEUMONIAE IN BOTH AEROBIC AND ANAEROBIC BOTTLES    Report Status 10/04/2015 FINAL  Final   Organism ID, Bacteria STREPTOCOCCUS PNEUMONIAE  Final      Susceptibility   Streptococcus pneumoniae - MIC*   PENICILLIN Value in next row       <=0.06SENSITIVE    ERYTHROMYCIN Value in next row Sensitive      <=0.06SENSITIVE    LEVOFLOXACIN Value in next row Sensitive      <=0.06SENSITIVE    VANCOMYCIN Value in next row Sensitive      <=0.06SENSITIVE    CEFTRIAXONE Value in next row Sensitive      <=0.06SENSITIVE    * STREPTOCOCCUS PNEUMONIAE  Blood Culture ID Panel (Reflexed)     Status: Abnormal   Collection Time: 09/24/2015 11:15 AM  Result Value Ref Range Status   Enterococcus species NOT DETECTED NOT DETECTED Final   Vancomycin resistance NOT DETECTED NOT DETECTED Final   Listeria monocytogenes NOT DETECTED NOT DETECTED Final   Staphylococcus species NOT DETECTED NOT DETECTED Final   Staphylococcus aureus NOT DETECTED NOT DETECTED Final   Methicillin resistance NOT DETECTED NOT DETECTED Final   Streptococcus species DETECTED (A) NOT DETECTED Final    Comment: CRITICAL RESULT CALLED TO, READ BACK BY AND VERIFIED WITH: NATE COOKSON AT 0154 10/02/15.PMH    Streptococcus agalactiae NOT DETECTED NOT DETECTED Final   Streptococcus pneumoniae DETECTED (A) NOT DETECTED Final    Comment: CRITICAL RESULT CALLED TO, READ BACK BY AND VERIFIED WITH: NATE COOKSON AT 0154 10/02/15.PMH    Streptococcus pyogenes NOT DETECTED NOT DETECTED Final   Acinetobacter baumannii NOT DETECTED NOT DETECTED Final  Enterobacteriaceae species NOT DETECTED NOT DETECTED Final   Enterobacter cloacae complex NOT DETECTED NOT DETECTED Final   Escherichia coli NOT DETECTED NOT DETECTED Final   Klebsiella oxytoca NOT DETECTED NOT DETECTED Final   Klebsiella pneumoniae NOT DETECTED NOT DETECTED Final   Proteus species NOT DETECTED NOT DETECTED Final   Serratia marcescens NOT DETECTED NOT DETECTED Final   Carbapenem resistance NOT DETECTED NOT DETECTED Final   Haemophilus influenzae NOT DETECTED NOT DETECTED Final   Neisseria meningitidis NOT DETECTED NOT DETECTED Final   Pseudomonas aeruginosa NOT DETECTED NOT DETECTED  Final   Candida albicans NOT DETECTED NOT DETECTED Final   Candida glabrata NOT DETECTED NOT DETECTED Final   Candida krusei NOT DETECTED NOT DETECTED Final   Candida parapsilosis NOT DETECTED NOT DETECTED Final   Candida tropicalis NOT DETECTED NOT DETECTED Final  Blood culture (routine x 2)     Status: Abnormal   Collection Time: 09/10/2015 11:54 AM  Result Value Ref Range Status   Specimen Description BLOOD RIGHT FOREARM  Final   Special Requests BOTTLES DRAWN AEROBIC AND ANAEROBIC 2CC  Final   Culture  Setup Time   Final    GRAM POSITIVE COCCI IN BOTH AEROBIC AND ANAEROBIC BOTTLES CRITICAL RESULT CALLED TO, READ BACK BY AND VERIFIED WITH: NATE COOKSON AT [place] 10/02/15.PMH CONFIRMED BY RWW    Culture (A)  Final    STREPTOCOCCUS PNEUMONIAE IN BOTH AEROBIC AND ANAEROBIC BOTTLES    Report Status 10/05/2015 FINAL  Final   Organism ID, Bacteria STREPTOCOCCUS PNEUMONIAE  Final      Susceptibility   Streptococcus pneumoniae - MIC*    ERYTHROMYCIN <=0.12 SENSITIVE Sensitive     LEVOFLOXACIN 1 SENSITIVE Sensitive     VANCOMYCIN 0.5 SENSITIVE Sensitive     * STREPTOCOCCUS PNEUMONIAE  MRSA PCR Screening     Status: None   Collection Time: 10/03/2015  6:31 PM  Result Value Ref Range Status   MRSA by PCR NEGATIVE NEGATIVE Final    Comment:        The GeneXpert MRSA Assay (FDA approved for NASAL specimens only), is one component of a comprehensive MRSA colonization surveillance program. It is not intended to diagnose MRSA infection nor to guide or monitor treatment for MRSA infections.   Culture, respiratory (tracheal aspirate)     Status: None   Collection Time: 10/03/2015  8:02 PM  Result Value Ref Range Status   Specimen Description TRACHEAL ASPIRATE  Final   Special Requests NONE  Final   Gram Stain MANY WBC SEEN RARE GRAM POSITIVE COCCI   Final   Culture NO GROWTH 3 DAYS  Final   Report Status 10/04/2015 FINAL  Final  CULTURE, BLOOD (ROUTINE X 2) w Reflex to PCR ID Panel      Status: None (Preliminary result)   Collection Time: 10/06/15  3:16 PM  Result Value Ref Range Status   Specimen Description BLOOD LEFT ASSIST CONTROL  Final   Special Requests   Final    BOTTLES DRAWN AEROBIC AND ANAEROBIC  AERO 10CC ANA 5CC   Culture NO GROWTH < 24 HOURS  Final   Report Status PENDING  Incomplete  CULTURE, BLOOD (ROUTINE X 2) w Reflex to PCR ID Panel     Status: None (Preliminary result)   Collection Time: 10/06/15  3:37 PM  Result Value Ref Range Status   Specimen Description BLOOD RIGHT ASSIST CONTROL  Final   Special Requests   Final    BOTTLES DRAWN AEROBIC AND ANAEROBIC  AERO 10CC ANA 15CC   Culture NO GROWTH < 24 HOURS  Final   Report Status PENDING  Incomplete    Medications:  Scheduled:  . antiseptic oral rinse  7 mL Mouth Rinse QID  . aspirin  81 mg Per Tube Daily  . budesonide (PULMICORT) nebulizer solution  0.5 mg Nebulization BID  . chlorhexidine gluconate (SAGE KIT)  15 mL Mouth Rinse BID  . enoxaparin (LOVENOX) injection  1 mg/kg Subcutaneous Q12H  . feeding supplement (PRO-STAT SUGAR FREE 64)  30 mL Per Tube TID  . folic acid  1 mg Per Tube Daily  . free water  100 mL Per Tube Q8H  . insulin aspart  2-6 Units Subcutaneous Q4H  . ipratropium-albuterol  3 mL Nebulization Q4H  . lactulose  30 g Per Tube TID  . methylPREDNISolone (SOLU-MEDROL) injection  40 mg Intravenous Q12H  . metoprolol tartrate  25 mg Per Tube BID  . pantoprazole sodium  40 mg Per Tube Q1200  . piperacillin-tazobactam  4.5 g Intravenous Q8H  . sodium chloride flush  10-40 mL Intracatheter Q12H  . [START ON 10/08/2015] vancomycin  1,000 mg Intravenous Q24H  . vecuronium      . vecuronium  10 mg Intravenous Once   Infusions:  . sodium chloride 75 mL/hr at 10/07/15 1023  . amiodarone 30 mg/hr (10/07/15 1403)  . feeding supplement (VITAL HIGH PROTEIN) Stopped (10/07/15 1303)  . HYDROmorphone 4 mg/hr (10/07/15 1535)  . midazolam (VERSED) infusion Stopped (10/07/15 1345)  .  propofol (DIPRIVAN) infusion 50 mcg/kg/min (10/07/15 1536)  . pureflow      Assessment: Pharmacy consulted to adjust medications for CRRT in this 50 y/o M with sepsis and declining renal function.   Plan:  Changed vancomycin to CRRT dosing. Will change Lovenox to heparin as per Dr. Mortimer Fries. No further medication adjustments necessary at this time. Will continue to monitor.   Ulice Dash D 10/07/2015,4:24 PM

## 2015-10-07 NOTE — Progress Notes (Signed)
Chaplain rounded the unit and provided a compassionate presence and spiritual support (silent prayer) to the patient.  Chaplain Rimsha Trembley [phone] 

## 2015-10-07 NOTE — Progress Notes (Signed)
Nutrition Follow-up  DOCUMENTATION CODES:   Morbid obesity  INTERVENTION:  -Recommend continuing current TF regimen; noted worsening renal function. May need to consider change in TF regimen but will reassess on follow  NUTRITION DIAGNOSIS:   Inadequate oral intake related to acute illness as evidenced by NPO status, per patient/family report.  Being addressed via TF  GOAL:   Provide needs based on ASPEN/SCCM guidelines  MONITOR:   TF tolerance, Vent status, Labs, I & O's, Weight trends  REASON FOR ASSESSMENT:   Ventilator, Consult Enteral/tube feeding initiation and management  ASSESSMENT:   50 yo male admitted with severe sepsis, acute respiratory failure with suspected pneumonia, hx of NASH. Pt with respiratory distressed on admission, initially placed on BiPap but required intubation 09/14/2015  Patient is currently intubated on ventilator support, febrile, WBC trending up. Plan for CT abdomen/pelvis today. Nephrology consulted today for worsening renal function  Tolerating Vital High Protein at rate of 60 ml/hr, Prostat TID.   Diet Order:   NPO  Skin:  Reviewed, no issues  Last BM:  10/06/15  Labs: sodium wdl (144), BUN 115, Creatinine 2.17  Glucose Profile:   Recent Labs  10/06/15 1546 10/06/15 2325 10/07/15 0727  GLUCAP 172* 180* 204*   Meds: NS at 75 ml/hr started today, lactulose, solumedrol  Height:   Ht Readings from Last 1 Encounters:  09/27/2015 5\' 10"  (1.778 m)    Weight:   Wt Readings from Last 1 Encounters:  10/07/15 293 lb 3.4 oz (133 kg)    Ideal Body Weight:  75.5 kg  BMI:  Body mass index is 42.07 kg/(m^2).  Estimated Nutritional Needs:   Kcal:  1485-1890 kcals  (11-14 kcals/kg using weight of 135.4 kg per ASPEN guidelines for BMI >30)  Protein:  >/= 151 g (2.0 g/kg)   Fluid:  >/= 1.5 L  EDUCATION NEEDS:   No education needs identified at this time  Romelle StarcherCate Leoma Folds MS, RD, LDN [phone] Pager  [phone]  Weekend/On-Call Pager

## 2015-10-07 NOTE — Progress Notes (Signed)
eLink Physician-Brief Progress Note Patient Name: Jonathon MusicStephen E Loman DOB: 05/01/1966 MRN: [id number]   Date of Service  10/07/2015  HPI/Events of Note  Ongoing hypotension despite reduction in sedation,  Liter fluid bolus.  Remains on Amio gtt for AF/RVR but has converted to SB with BP of 81/44 (56).  On CRRT with goal to remove 100 cc/hr.  eICU Interventions  Plan: D/C amio gtt. Start NEO for BP support Avoid additional fluid since goal is to attempt to remove fluid with RRT.     Intervention Category Major Interventions: Hypotension - evaluation and management  Delenn Ahn 10/07/2015, 11:33 PM

## 2015-10-07 NOTE — Progress Notes (Signed)
After further discussion with Dr. Belia HemanKasa, will proceed with CRRT given worsening azotemia, new infiltrates on CXR, increased O2 requirement.

## 2015-10-07 NOTE — Progress Notes (Signed)
PULMONARY / CRITICAL CARE MEDICINE   Name: Jonathon Bryant MRN: [id number] DOB: 07/12/1965    ADMISSION DATE:  09/10/2015   PT PROFILE:   [age] M with Htn, NASH admitted with fever, acidosis, AMS, severe sepsis, presumed RLL PNA. Intubated in ED. Developed AFRVR in ED  Subjective + febrile.  now with afib with  RVR, fio2 at 40%, PEEp at 8 Increased WOB on PRVC mode, remains obtunded Fever work up pending(US LE, Ct head, ct abd/pelvis) US shows minimal fluid per radiology  MAJOR EVENTS/TEST RESULTS: 04/28 Admitted with diagnoses of severe sepsis, acute respiratory failure, suspected PNA, h/o NASH. Developed AFRVR in ED and suffered NSVT while CVL being placed 04/28 Abdominal US: No ascites seen 04/28 TTE: LVEF 50-55%. LA mildly dilated. Normal PA pressures 04/29 BC positive for pneumococcus 04/29 refractory hypoxemia - vent changed to PC mode 04/30 improved oxygenation. AF persists. Full dose enoxaparin initiated 5/2 afib with rvr-amiodarone started 5/3 ct hest b/l infiltrates effusion,   INDWELLING DEVICES:: ETT 04/28 >>  R IJ CVL 04/28 >>   MICRO DATA: MRSA PCR 04/28 >> NEG Urine 04/28 >> UA negative Resp  04/28 >>  Blood 04/28 >> 2/2 pneumococcus  PCT 04/28 - 04/30: 19.05, 23.35, 17.25  ANTIMICROBIALS:  Vanc 04/28 >> 04/29 Levofloxacin 04/28 >>     VITAL SIGNS: BP 114/90 mmHg  Pulse 141  Temp(Src) 100.2 F (37.9 C) (Core (Comment))  Resp 18  Ht 5\' 10"  (1.778 m)  Wt 293 lb 3.4 oz (133 kg)  BMI 42.07 kg/m2  SpO2 90%  HEMODYNAMICS:    VENTILATOR SETTINGS: Vent Mode:  [-] PRVC FiO2 (%):  [40 %-50 %] 40 % Set Rate:  [16 bmp] 16 bmp Vt Set:  [16 mL] 16 mL PEEP:  [5 cmH20] 5 cmH20  INTAKE / OUTPUT: I/O last 3 completed shifts: In: 8101.4 [I.V.:2213.4; NG/GT:2888; IV Piggyback:3000] Out: 3800 [Urine:3800]  PHYSICAL EXAMINATION: General: Obese, RASS -4, intubated and sedated/NMB Neuro: Limited exam due to NMB HEENT: PERRLA, mild scleral edema, ETT, oral  mucosa pink Cardiovascular: IRIR in the low 100s, no MRG  Lungs: basilar crackles and rhonchi, mild expiratory wheezes in right upper lung fields Abdomen: obese, soft, ND, diminished BS Ext: warm, no edema Skin: limited exam, no lesions noted  LABS:  BMET  Recent Labs Lab 10/04/15 0548 10/06/15 0510 10/07/15 0753  NA 144 147* 144  K 4.4 4.9 3.9  CL 106 106 101  CO2 31 31 29   BUN 82* 100* 115*  CREATININE 1.82* 2.21* 2.17*  GLUCOSE 131* 219* 212*    Electrolytes  Recent Labs Lab 10/04/15 0548 10/06/15 0510 10/07/15 0753  CALCIUM 8.3* 8.6* 8.4*  MG  --   --  2.7*  PHOS  --   --  4.2    CBC  Recent Labs Lab 10/04/15 0548 10/06/15 0510 10/07/15 0753  WBC 16.2* 20.1* 26.0*  HGB 12.9* 13.1 13.1  HCT 37.9* 38.4* 39.0*  PLT 89* 118* 129*    Coag's  Recent Labs Lab 09/19/2015 1115  APTT 29  INR 1.68    Sepsis Markers  Recent Labs Lab 09/28/2015 1625 10/03/2015 1950 09/04/2015 2332 10/02/15 0505 10/03/15 0425 10/04/15 0548  LATICACIDVEN 7.6* 7.3* 5.2*  --   --   --   PROCALCITON  --   --   --  23.35 17.25 12.03    ABG  Recent Labs Lab 09/04/2015 2002 09/22/2015 2211 10/02/15 0646  PHART 7.29* 7.31* 7.20*  PCO2ART 36 44 77*  PO2ART  59* 64* 61*    Liver Enzymes  Recent Labs Lab 10/02/15 0505 10/03/15 0425 10/04/15 0548  AST 65* 113* 133*  ALT 38 44 47  ALKPHOS 39 61 80  BILITOT 2.7* 2.5* 2.9*  ALBUMIN 2.6* 2.5* 2.3*    Cardiac Enzymes  Recent Labs Lab 2015-10-07 1115 10/02/15 0505 10/03/15 0425  TROPONINI 0.05* 0.14* 0.12*    Glucose  Recent Labs Lab 10/05/15 1604 10/06/15 0009 10/06/15 0733 10/06/15 1546 10/06/15 2325 10/07/15 0727  GLUCAP 132* 144* 179* 172* 180* 204*    [place] Chest  10/06/2015  CLINICAL DATA:  50 year old male with a history of small pleural effusion on CT and chest x-ray, with diagnosis of pneumococcal pneumonia. He has been referred for potential thoracentesis. Patient is ICU status on a ventilator.  EXAM: CHEST ULTRASOUND COMPARISON:  CT 10/05/2015, chest x-ray 10/06/2015 FINDINGS: Ultrasound survey of the right chest for possible thoracentesis demonstrates small pleural fluid without complex features. Ultrasound-guided thoracentesis was deferred at this time given the small size. IMPRESSION: Small, layering right pleural effusion, likely parapneumonic. Signed, [name]. Loreta Ave, DO [facility] Electronically Signed   By: Gilmer Mor D.O.   On: 10/06/2015 12:31   CXR 05/01 PENDING  Discussion 50 yo male with a PMH  HTN, NASH admitted with fever, acidosis, AMS, severe sepsis secondary to strep pneumonia; now intubated and sedated;   ASSESSMENT / PLAN:  PULMONARY A:  Acute hypoxic respiratory failure Severe hypoxemia-still requiring >50% FiO2 RLL CAP - pneumococcal Smoker without h/o COPD Possible para-pneumoic effusion P:   Cont full vent support with PRVC mode - settings reviewed and/or adjusted Cont vent bundle Daily SBT if/when meets criteria Daily CXR ABG pending  CARDIOVASCULAR A:  New onset AFRVR - controlled with metoprolol Transient septic shock, resolved H/O hypertension P:  Monitor rhythm and BP on amiodarone  Continue full dose LMWH 04/30 for anticoagulation   RENAL A:   AKI, nonoliguric Lactic acidosis - mild, due to severe sepsis P:   Monitor BMET intermittently Monitor I/Os Correct electrolytes as indicated Avoid nephrotoxic medications  GASTROINTESTINAL A:   Obesity NASH P:   SUP: enteral PPI TF protocol initiated 04/28 Monitor LFTs intermittently  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 P:  DVT px: full dose LMWH for AF Monitor CBC intermittently Transfuse per usual guidelines Monitor plt count closely while on LMWH  INFECTIOUS A:   Severe sepsis-persistent fever RLL pneumococcal PNA Possible R para-pneumonic effusion Minimal fluid seen on  [place] of chest 04/30 Still with fevers, now with rash, ID consulted P:   Monitor temp, WBC count Micro and abx as above ID consulted  ENDOCRINE A:   No issues   P:   Monitor CBGs Consider SSI if glu > 180  NEUROLOGIC A:   Acute metabolic encephalopathy  Hyperammonemia  P:   RASS goal: -1, -2 PAD protocol 04/28 - propofol-4/28, PRN fentanyl, PRN midaz and fentanyl; gtt 04/28 Cont Lactulose and monitor ammonia level    Plan for today: 1)US LE 2)CT head 3)Ct abd pelvis  Wife at bedside updated  I have personally reviewed/obtained a history, examined the patient, evaluated Pertinent laboratory and RadioGraphic/imaging results, and  formulated the assessment and plan   The Patient requires high complexity decision making for assessment and support, frequent evaluation and titration of therapies, application of advanced monitoring technologies and extensive interpretation of multiple databases.  Critical Care Time devoted to patient care services described in this note  is 40 minutes.    Overall, patient is critically ill, prognosis is guarded.  Patient with Multiorgan failure and at high risk for cardiac arrest and death.    Lucie Leather, M.D.  [facility]  Medical Director [facility] Medical Director [facility] Cardio-Pulmonary Department

## 2015-10-07 NOTE — Progress Notes (Signed)
ANTICOAGULATION CONSULT NOTE - Initial Consult  Pharmacy Consult for Heparin  Indication: atrial fibrillation  No Known Allergies  Patient Measurements: Height: 5' 10"  (177.8 cm) Weight: 293 lb 3.4 oz (133 kg) IBW/kg (Calculated) : 73 Heparin Dosing Weight: 103.8 kg  Vital Signs: Temp: 100.2 F (37.9 C) (05/04 1000) Temp Source: Core (Comment) (05/04 0700) BP: 114/90 mmHg (05/04 1023) Pulse Rate: 141 (05/04 1023)  Labs:  Recent Labs  10/06/15 0510 10/07/15 0753  HGB 13.1 13.1  HCT 38.4* 39.0*  PLT 118* 129*  CREATININE 2.21* 2.17*    Estimated Creatinine Clearance: 54.6 mL/min (by C-G formula based on Cr of 2.17).   Medical History: Past Medical History  Diagnosis Date  . Hypertension   . Cirrhosis ([place])     Medications:  Scheduled:  . antiseptic oral rinse  7 mL Mouth Rinse QID  . aspirin  81 mg Per Tube Daily  . budesonide (PULMICORT) nebulizer solution  0.5 mg Nebulization BID  . chlorhexidine gluconate (SAGE KIT)  15 mL Mouth Rinse BID  . feeding supplement (PRO-STAT SUGAR FREE 64)  30 mL Per Tube TID  . folic acid  1 mg Per Tube Daily  . free water  100 mL Per Tube Q8H  . insulin aspart  2-6 Units Subcutaneous Q4H  . ipratropium-albuterol  3 mL Nebulization Q4H  . lactulose  30 g Per Tube TID  . methylPREDNISolone (SOLU-MEDROL) injection  40 mg Intravenous Q12H  . metoprolol tartrate  25 mg Per Tube BID  . pantoprazole sodium  40 mg Per Tube Q1200  . piperacillin-tazobactam  4.5 g Intravenous Q8H  . sodium chloride flush  10-40 mL Intracatheter Q12H  . [START ON 10/08/2015] vancomycin  1,000 mg Intravenous Q24H  . vecuronium      . vecuronium  10 mg Intravenous Once   Infusions:  . sodium chloride 75 mL/hr at 10/07/15 1023  . amiodarone 30 mg/hr (10/07/15 1403)  . feeding supplement (VITAL HIGH PROTEIN) Stopped (10/07/15 1303)  . heparin    . HYDROmorphone 4 mg/hr (10/07/15 1535)  . midazolam (VERSED) infusion Stopped (10/07/15 1345)  .  propofol (DIPRIVAN) infusion 50 mcg/kg/min (10/07/15 1536)  . pureflow      Assessment: 50 y/o M with sepsis intubated and sedated on Lovenox 1 mg/kg for afib to start CRRT so will need to transition to heparin drip.   Goal of Therapy:  Heparin level 0.3-0.7 units/ml Monitor platelets by anticoagulation protocol: Yes   Plan:  Will not bolus as patient on Lovenox. Will begin heparin infusion 11 hours after last Lovenox dose.  Start heparin infusion at 1550 units/hr Check anti-Xa level in 8 hours and daily while on heparin Continue to monitor H&H and platelets  Ulice Dash D 10/07/2015,4:47 PM

## 2015-10-07 NOTE — Consult Note (Signed)
[facility] CONSULT NOTE    Date: 10/07/2015                  Patient Name:  Jonathon Bryant  MRN: [id number]  DOB: 1965/08/27  Age / Sex: 50 y.o., male         PCP: Emelia Salisbury, MD                 Service Requesting Consult: Dr. Mortimer Fries                 Reason for Consult: Acute renal failure            History of Present Illness: Patient is a 50 y.o. 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 He was brought to [facility] emergency department and was initially placed on BiPAP but secondary to worsening dyspnea he underwent intubation. He is now on the ventilator and unable to provide any history.  He now has ongoing respiratory failure, acute renal failure, leukocytosis, and fever.  He has strep pneumo bacteremia.  He has been administered ibuprofen as well as high protein tube feeds. He is also on steroid therapy. His creatinine has gone from 1.44 up to 2.1. He has increasing azotemia as well with a BUN of 115.  Urine output was found to be 2.5 L over the preceding 24 hours.   Medications: Outpatient medications: Prescriptions prior to admission  Medication Sig Dispense Refill Last Dose  . furosemide (LASIX) 20 MG tablet Take 40 mg by mouth 2 (two) times daily.   unknown at unknown  . gabapentin (NEURONTIN) 100 MG capsule Take 300 mg by mouth 3 (three) times daily.   unknown at unknown  . lisinopril-hydrochlorothiazide (PRINZIDE,ZESTORETIC) 20-12.5 MG tablet Take 1 tablet by mouth daily.   unknown at unknown  . metolazone (ZAROXOLYN) 5 MG tablet Take 5 mg by mouth daily.   unknown at unknown  . sertraline (ZOLOFT) 100 MG tablet Take 150 mg by mouth daily.   unknown at unknown  . spironolactone (ALDACTONE) 50 MG tablet Take 200 mg by mouth daily.   unknown at unknown  . traZODone (DESYREL) 100 MG tablet Take 100 mg by mouth at bedtime.    unknown at unknown    Current medications: Current Facility-Administered Medications  Medication Dose Route Frequency Provider Last Rate Last Dose  . 0.9 %  sodium chloride infusion  250 mL Intravenous PRN Wilhelmina Mcardle, MD      . acetaminophen (TYLENOL) tablet 650 mg  650 mg Oral Q6H PRN Mikael Spray, NP   650 mg at 10/06/15 0506  . amiodarone (NEXTERONE PREMIX) 360-4.14 MG/200ML-% (1.8 mg/mL) IV infusion  30 mg/hr Intravenous Continuous Flora Lipps, MD 16.7 mL/hr at 10/07/15 0049 30 mg/hr at 10/07/15 0049  . antiseptic oral rinse solution (CORINZ)  7 mL Mouth Rinse QID Wilhelmina Mcardle, MD   7 mL at 10/07/15 0351  . aspirin chewable tablet 81 mg  81 mg Per Tube Daily Wilhelmina Mcardle, MD   81 mg at 10/06/15 0919  . bisacodyl (DULCOLAX) suppository 10 mg  10 mg Rectal Daily PRN Wilhelmina Mcardle, MD      . budesonide (PULMICORT) nebulizer solution 0.5 mg  0.5 mg Nebulization BID Flora Lipps, MD   0.5 mg at 10/07/15 0758  . chlorhexidine gluconate (SAGE KIT) (PERIDEX) 0.12 %  solution 15 mL  15 mL Mouth Rinse BID Wilhelmina Mcardle, MD   15 mL at 10/07/15 0753  . enoxaparin (LOVENOX) injection 130 mg  1 mg/kg Subcutaneous Q12H Loleta Dicker, RPH   130 mg at 10/06/15 2215  . feeding supplement (PRO-STAT SUGAR FREE 64) liquid 30 mL  30 mL Per Tube TID Wilhelmina Mcardle, MD   30 mL at 10/06/15 2214  . feeding supplement (VITAL HIGH PROTEIN) liquid 1,000 mL  1,000 mL Per Tube Continuous Wilhelmina Mcardle, MD 60 mL/hr at 10/05/15 2201 1,000 mL at 10/05/15 2201  . fentaNYL (SUBLIMAZE) bolus via infusion 50-100 mcg  50-100 mcg Intravenous Q1H PRN Wilhelmina Mcardle, MD   50 mcg at 10/03/15 1945  . fentaNYL 2577mg in NS 2563m(101mml) infusion-PREMIX  0-400 mcg/hr Intravenous Continuous DavWilhelmina McardleD 15 mL/hr at 10/07/15 0815 150 mcg/hr at 10/07/15 0815  . folic acid (FOLVITE) tablet 1 mg  1 mg Per Tube Daily DavWilhelmina McardleD   1 mg at 10/06/15 0920  . free water 100 mL  100 mL Per Tube Q8H  DavWilhelmina McardleD   100 mL at 10/07/15 0600  . ibuprofen (ADVIL,MOTRIN) 100 MG/5ML suspension 800 mg  800 mg Per Tube Q8H PRN KurFlora LippsD   800 mg at 10/06/15 1717  . ipratropium-albuterol (DUONEB) 0.5-2.5 (3) MG/3ML nebulizer solution 3 mL  3 mL Nebulization Q4H KurFlora LippsD   3 mL at 10/07/15 0758  . lactulose (CHRONULAC) 10 GM/15ML solution 30 g  30 g Per Tube TID KurFlora LippsD   30 g at 10/06/15 2215  . methylPREDNISolone sodium succinate (SOLU-MEDROL) 40 mg/mL injection 40 mg  40 mg Intravenous Q12H KurFlora LippsD   40 mg at 10/06/15 2334  . metoprolol (LOPRESSOR) injection 2.5-5 mg  2.5-5 mg Intravenous Q3H PRN DavWilhelmina McardleD   5 mg at 10/06/15 2009  . metoprolol tartrate (LOPRESSOR) tablet 25 mg  25 mg Per Tube BID CryLoleta DickerPH   25 mg at 10/06/15 2215  . midazolam (VERSED) injection 2-4 mg  2-4 mg Intravenous Q2H PRN MagMikael SprayP   4 mg at 10/07/15 0154  . pantoprazole sodium (PROTONIX) 40 mg/20 mL oral suspension 40 mg  40 mg Per Tube Q1200 DavWilhelmina McardleD   40 mg at 10/06/15 1202  . penicillin G potassium 4 Million Units in dextrose 5 % 250 mL IVPB  4 Million Units Intravenous Q4H KurFlora LippsD   4 Million Units at 10/07/15 0753  . sennosides (SENOKOT) 8.8 MG/5ML syrup 5 mL  5 mL Per Tube BID PRN MagMikael SprayP      . sodium chloride flush (NS) 0.9 % injection 10-40 mL  10-40 mL Intracatheter Q12H DavWilhelmina McardleD   10 mL at 10/06/15 2216      Allergies: No Known Allergies    Past Medical History: Past Medical History  Diagnosis Date  . Hypertension   . [place]    Past Surgical History: History reviewed. No pertinent past surgical history.   Family History: History reviewed. No pertinent family history.   Social History: Social History   Social History  . Marital Status: Married    Spouse Name: N/A  . Number of Children: N/A  . Years of Education: N/A   Occupational History  . Not on file.    Social History Main Topics  . Smoking status: Current  Every Day Smoker  . Smokeless tobacco: Not on file  . Alcohol Use: Not on file  . Drug Use: Not on file  . Sexual Activity: Not on file   Other Topics Concern  . Not on file   Social History Narrative  . No narrative on file     Review of Systems: As per HPI  Vital Signs: Blood pressure 117/79, pulse 137, temperature 99.3 F (37.4 C), temperature source Core (Comment), resp. rate 14, height 5' 10"  (1.778 m), weight 133 kg (293 lb 3.4 oz), SpO2 91 %.  Weight trends: Filed Weights   10/06/15 0500 10/07/15 0400 10/07/15 0405  Weight: 131.4 kg (289 lb 11 oz) 133 kg (293 lb 3.4 oz) 133 kg (293 lb 3.4 oz)    Physical Exam: General: Critically ill appearing  Head: Normocephalic, atraumatic.  Eyes: Pupils 67m and sluggish ro react  Nose: Mucous membranes moist, not inflammed, nonerythematous.  Throat: Endotracheal tube in place, OG tube in place  Neck: Supple, trachea midline.  Lungs:  Coarse breath sounds bilateral, vent assisted  Heart: S1S2 no rubs  Abdomen:  BS normoactive. Soft, Nondistended, non-tender.  No masses or organomegaly.  Extremities: Trace bl LE edema  Neurologic: Not following commands  Skin: Rash noted on both arms    Lab results: Basic Metabolic Panel:  Recent Labs Lab 10/04/15 0548 10/06/15 0510 10/07/15 0753  NA 144 147* 144  K 4.4 4.9 3.9  CL 106 106 101  CO2 31 31 29   GLUCOSE 131* 219* 212*  BUN 82* 100* 115*  CREATININE 1.82* 2.21* 2.17*  CALCIUM 8.3* 8.6* 8.4*  MG  --   --  2.7*  PHOS  --   --  4.2    Liver Function Tests:  Recent Labs Lab 10/02/15 0505 10/03/15 0425 10/04/15 0548  AST 65* 113* 133*  ALT 38 44 47  ALKPHOS 39 61 80  BILITOT 2.7* 2.5* 2.9*  PROT 5.9* 5.9* 5.8*  ALBUMIN 2.6* 2.5* 2.3*    Recent Labs Lab 09/04/2015 1115  LIPASE 31    Recent Labs Lab 09/10/2015 1154  AMMONIA 77*    CBC:  Recent Labs Lab 09/12/2015 1115 10/02/15 0505  10/03/15 0425 10/04/15 0548 10/06/15 0510 10/07/15 0753  WBC 27.8* 14.7* 16.3* 16.2* 20.1* 26.0*  NEUTROABS 26.4*  --   --   --   --   --   HGB 14.1 13.1 13.0 12.9* 13.1 13.1  HCT 42.9 39.0* 39.0* 37.9* 38.4* 39.0*  MCV 103.5* 104.0* 103.3* 102.1* 104.0* 101.7*  PLT 63* 64* 84* 89* 118* 129*    Cardiac Enzymes:  Recent Labs Lab 09/30/2015 1115 10/02/15 0505 10/03/15 0425  TROPONINI 0.05* 0.14* 0.12*    BNP: Invalid input(s): POCBNP  CBG:  Recent Labs Lab 10/06/15 0009 10/06/15 0733 10/06/15 1546 10/06/15 2325 10/07/15 0727  GLUCAP 144* 179* 172* 180* 204*    Microbiology: Results for orders placed or performed during the hospital encounter of 09/26/2015  Blood culture (routine x 2)     Status: Abnormal   Collection Time: 09/08/2015 11:15 AM  Result Value Ref Range Status   Specimen Description BLOOD RIGHT ANTECUBITAL  Final   Special Requests BOTTLES DRAWN AEROBIC AND ANAEROBIC 1CC  Final   Culture  Setup Time   Final    GRAM POSITIVE COCCI IN BOTH AEROBIC AND ANAEROBIC BOTTLES CRITICAL RESULT CALLED TO, READ BACK BY AND VERIFIED WITH: NATE COOKSON AT 0154 10/02/15.PMH CONFIRMED BY RWW    Culture (A)  Final  STREPTOCOCCUS PNEUMONIAE IN BOTH AEROBIC AND ANAEROBIC BOTTLES    Report Status 10/04/2015 FINAL  Final   Organism ID, Bacteria STREPTOCOCCUS PNEUMONIAE  Final      Susceptibility   Streptococcus pneumoniae - MIC*    PENICILLIN Value in next row       <=0.06SENSITIVE    ERYTHROMYCIN Value in next row Sensitive      <=0.06SENSITIVE    LEVOFLOXACIN Value in next row Sensitive      <=0.06SENSITIVE    VANCOMYCIN Value in next row Sensitive      <=0.06SENSITIVE    CEFTRIAXONE Value in next row Sensitive      <=0.06SENSITIVE    * STREPTOCOCCUS PNEUMONIAE  Blood Culture ID Panel (Reflexed)     Status: Abnormal   Collection Time: 09/08/2015 11:15 AM  Result Value Ref Range Status   Enterococcus species NOT DETECTED NOT DETECTED Final   Vancomycin  resistance NOT DETECTED NOT DETECTED Final   Listeria monocytogenes NOT DETECTED NOT DETECTED Final   Staphylococcus species NOT DETECTED NOT DETECTED Final   Staphylococcus aureus NOT DETECTED NOT DETECTED Final   Methicillin resistance NOT DETECTED NOT DETECTED Final   Streptococcus species DETECTED (A) NOT DETECTED Final    Comment: CRITICAL RESULT CALLED TO, READ BACK BY AND VERIFIED WITH: NATE COOKSON AT 0154 10/02/15.PMH    Streptococcus agalactiae NOT DETECTED NOT DETECTED Final   Streptococcus pneumoniae DETECTED (A) NOT DETECTED Final    Comment: CRITICAL RESULT CALLED TO, READ BACK BY AND VERIFIED WITH: NATE COOKSON AT 0154 10/02/15.PMH    Streptococcus pyogenes NOT DETECTED NOT DETECTED Final   Acinetobacter baumannii NOT DETECTED NOT DETECTED Final   Enterobacteriaceae species NOT DETECTED NOT DETECTED Final   Enterobacter cloacae complex NOT DETECTED NOT DETECTED Final   Escherichia coli NOT DETECTED NOT DETECTED Final   Klebsiella oxytoca NOT DETECTED NOT DETECTED Final   Klebsiella pneumoniae NOT DETECTED NOT DETECTED Final   Proteus species NOT DETECTED NOT DETECTED Final   Serratia marcescens NOT DETECTED NOT DETECTED Final   Carbapenem resistance NOT DETECTED NOT DETECTED Final   Haemophilus influenzae NOT DETECTED NOT DETECTED Final   Neisseria meningitidis NOT DETECTED NOT DETECTED Final   Pseudomonas aeruginosa NOT DETECTED NOT DETECTED Final   Candida albicans NOT DETECTED NOT DETECTED Final   Candida glabrata NOT DETECTED NOT DETECTED Final   Candida krusei NOT DETECTED NOT DETECTED Final   Candida parapsilosis NOT DETECTED NOT DETECTED Final   Candida tropicalis NOT DETECTED NOT DETECTED Final  Blood culture (routine x 2)     Status: Abnormal   Collection Time: 09/10/2015 11:54 AM  Result Value Ref Range Status   Specimen Description BLOOD RIGHT FOREARM  Final   Special Requests BOTTLES DRAWN AEROBIC AND ANAEROBIC 2CC  Final   Culture  Setup Time   Final     GRAM POSITIVE COCCI IN BOTH AEROBIC AND ANAEROBIC BOTTLES CRITICAL RESULT CALLED TO, READ BACK BY AND VERIFIED WITH: NATE COOKSON AT 0154 10/02/15.PMH CONFIRMED BY RWW    Culture (A)  Final    STREPTOCOCCUS PNEUMONIAE IN BOTH AEROBIC AND ANAEROBIC BOTTLES    Report Status 10/05/2015 FINAL  Final   Organism ID, Bacteria STREPTOCOCCUS PNEUMONIAE  Final      Susceptibility   Streptococcus pneumoniae - MIC*    ERYTHROMYCIN <=0.12 SENSITIVE Sensitive     LEVOFLOXACIN 1 SENSITIVE Sensitive     VANCOMYCIN 0.5 SENSITIVE Sensitive     * STREPTOCOCCUS PNEUMONIAE  MRSA PCR Screening  Status: None   Collection Time: 09/05/2015  6:31 PM  Result Value Ref Range Status   MRSA by PCR NEGATIVE NEGATIVE Final    Comment:        The GeneXpert MRSA Assay (FDA approved for NASAL specimens only), is one component of a comprehensive MRSA colonization surveillance program. It is not intended to diagnose MRSA infection nor to guide or monitor treatment for MRSA infections.   Culture, respiratory (tracheal aspirate)     Status: None   Collection Time: 09/11/2015  8:02 PM  Result Value Ref Range Status   Specimen Description TRACHEAL ASPIRATE  Final   Special Requests NONE  Final   Gram Stain MANY WBC SEEN RARE GRAM POSITIVE COCCI   Final   Culture NO GROWTH 3 DAYS  Final   Report Status 10/04/2015 FINAL  Final    Coagulation Studies: No results for input(s): LABPROT, INR in the last 72 hours.  Urinalysis: No results for input(s): COLORURINE, LABSPEC, PHURINE, GLUCOSEU, HGBUR, BILIRUBINUR, KETONESUR, PROTEINUR, UROBILINOGEN, NITRITE, LEUKOCYTESUR in the last 72 hours.  Invalid input(s): APPERANCEUR    Imaging: Ct Chest Wo Contrast  10/05/2015  CLINICAL DATA:  50 year old male inpatient admitted with fever, sepsis and strep pneumonia, intubated requiring high FiO2. EXAM: CT CHEST WITHOUT CONTRAST TECHNIQUE: Multidetector CT imaging of the chest was performed following the standard protocol  without IV contrast. COMPARISON:  Chest radiograph from one day prior. 03/23/2014 chest CT angiogram. FINDINGS: Motion degraded study. Mediastinum/Nodes: Normal heart size. No pericardial fluid/thickening. Left main, left anterior descending, left circumflex and right coronary atherosclerosis. Atherosclerotic nonaneurysmal thoracic aorta. Normal caliber pulmonary arteries. Normal visualized thyroid. Enteric tube terminates in the distal body of the stomach. No appreciable esophageal wall thickening. No axillary adenopathy. Mild right paratracheal, AP window and subcarinal lymphadenopathy, for example a 1.3 cm right paratracheal node (series 2/image 40), a 1.0 cm AP window node (series 2/ image 42) and a 1.2 cm subcarinal node (series 2/image 60), all new since 03/23/2014. No gross hilar adenopathy on this noncontrast study. Lungs/Pleura: Endotracheal tube is well positioned with the tip 3.6 cm above the carina. No pneumothorax. Layering small right pleural effusion. No left pleural effusion. There is extensive patchy consolidation and ground-glass opacity throughout both lungs involving all lung lobes. There is volume loss in the dependent right lower lobe in keeping with mild compressive atelectasis. No significant pulmonary nodules are seen in the aerated portions of the lungs. Upper abdomen: Diffusely irregular liver surface, consistent with cirrhosis. Trace upper abdominal ascites. Partially visualized splenomegaly. Top-normal size gastrohepatic ligament nodes measuring up to 0.9 cm appear stable. Musculoskeletal: No aggressive appearing focal osseous lesions. Mild degenerative changes in the thoracic spine. Symmetric mild gynecomastia. IMPRESSION: 1. Extensive patchy consolidation and ground-glass opacity throughout both lungs involving all lung lobes, most suggestive of severe multifocal pneumonia and/or acute interstitial pneumonia (AIP)/ARDS. 2. Small right pleural effusion. 3. Mild mediastinal  lymphadenopathy is nonspecific, probably reactive. 4. Cirrhosis. Partially visualized splenomegaly. Trace ascites in the upper abdomen. 5. Left main and 3 vessel coronary atherosclerosis. 6. Well-positioned enteric and endotracheal tubes. Electronically Signed   By: Ilona Sorrel M.D.   On: 10/05/2015 14:23   [place] Chest  10/06/2015  CLINICAL DATA:  50 year old male with a history of small pleural effusion on CT and chest x-ray, with diagnosis of pneumococcal pneumonia. He has been referred for potential thoracentesis. Patient is ICU status on a ventilator. EXAM: CHEST ULTRASOUND COMPARISON:  CT 10/05/2015, chest x-ray 10/06/2015 FINDINGS: Ultrasound survey of the  right chest for possible thoracentesis demonstrates small pleural fluid without complex features. Ultrasound-guided thoracentesis was deferred at this time given the small size. IMPRESSION: Small, layering right pleural effusion, likely parapneumonic. Signed, Dulcy Fanny. Earleen Newport, DO [facility] Electronically Signed   By: Corrie Mckusick D.O.   On: 10/06/2015 12:31   Dg Chest Port 1 View  10/06/2015  CLINICAL DATA:  Hypoxia EXAM: PORTABLE CHEST 1 VIEW COMPARISON:  Chest radiograph Oct 04, 2015 and chest CT Oct 05, 2015 FINDINGS: Endotracheal tube tip is 4.4 cm above the carina. Nasogastric tube tip and side port are below the diaphragm. Central catheter tip is in the superior vena cava. No pneumothorax. There is patchy airspace consolidation in the right lower lobe. There is mild left base atelectasis. Note that the extensive airspace consolidation in the left upper lobe seen on CT 1 day prior is not evident on radiographic examination. Heart is mildly enlarged with pulmonary vascularity within normal limits. No adenopathy evident. IMPRESSION: Tube and catheter positions as described without pneumothorax. Persistent right lower lobe region airspace consolidation. Mild left base atelectasis. There is  slightly less opacity in the left base compared to 1 day prior. Note that the airspace consolidation in the left upper lobe seen on CT 1 day prior is not appreciable by radiography. Electronically Signed   By: Lowella Grip III M.D.   On: 10/06/2015 07:51      Assessment & Plan: Pt is a 50 y.o. 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  1.  acute renal failure secondary to acute tubular necrosis from sepsis.  Patient presented with underlying sepsis from Streptococcus pneumonia. Additional contributors to azotemia including use of steroids and high protein tube feeds. Ibuprofen also used for high fevers. - At this point in time we recommend discontinuation of ibuprofen. We would also start the patient on 0.9 normal saline at 75 cc per hour. No urgent indication for dialysis at the patient is making urine. We'll also obtain renal ultrasound to ensure there is no underlying obstruction. Patient is at high risk for further deterioration of renal function and the potential for renal replacement therapy.  Since the patient has cirrhosis avoid Tylenol as well as ibuprofen. Use cooling blanket for patient cooling.  2. Respiratory failure secondary to pneumonia. Patient has Streptococcus pneumonia bacteremia and pneumonia. He is maintained on the ventilator. Anabolic management per Dr. Ola Spurr.  3. Sepsis with Streptococcus pneumoniae septicemia A40.3.Marland Kitchen  Continue penicillin G for strep pneumo.

## 2015-10-07 NOTE — Progress Notes (Signed)
eLink Physician-Brief Progress Note Patient Name: Jonathon MusicStephen E Like DOB: 06/22/1965 MRN: [id number]   Date of Service  10/07/2015  HPI/Events of Note  Hypotension - BP = 75/45.  eICU Interventions  Will bolus with 0.9 NaCl 1 liter IV over 1 hour now.      Intervention Category Major Interventions: Hypotension - evaluation and management  Jonathon Bryant Eugene 10/07/2015, 7:12 PM

## 2015-10-07 NOTE — Progress Notes (Signed)
Pharmacy Antibiotic Note  Jonathon Bryant is a 50 y.o. male admitted on 09/20/2015 with Streptococcus pneumoniae bacteremia. Patient on penicillin G to which organism is sensitive but continues to spike fevers and chest xray shows increased consolidation. Pharmacy has been consulted for vancomycin and Zosyn dosing.  Plan: Vancomycin 1000 IV every 12 hours with stacked dosing and a trough with the 5th total dose.  Goal trough 15-20 mcg/mL. Zosyn 4.5 g IV q8h (4 hour infusion).  Height: 5\' 10"  (177.8 cm) Weight: 293 lb 3.4 oz (133 kg) IBW/kg (Calculated) : 73  Temp (24hrs), Avg:100.1 F (37.8 C), Min:96.8 F (36 C), Max:102.4 F (39.1 C)   Recent Labs Lab 09/23/2015 1115 09/26/2015 1625 09/14/2015 1950 09/05/2015 2332 10/02/15 0505 10/03/15 0425 10/04/15 0548 10/06/15 0510 10/07/15 0753  WBC 27.8*  --   --   --  14.7* 16.3* 16.2* 20.1* 26.0*  CREATININE 1.44*  --   --   --  1.85* 1.88* 1.82* 2.21* 2.17*  LATICACIDVEN 7.0* 7.6* 7.3* 5.2*  --   --   --   --   --     Estimated Creatinine Clearance: 54.6 mL/min (by C-G formula based on Cr of 2.17).    No Known Allergies  Antimicrobials this admission: Levaquin 4/28 >> 5/1 Vancomycin 4/28 >> 4/29 Zosyn 4/28 >> 4/28 Ceftriaxone 4/28 >> 4/28 Pen G 5/1 >> 5/4 Vancomycin 5/4 >> Zosyn 5/4 >>   Dose adjustments this admission:   Microbiology results: 5/4 TA: pending 5/3 BCx: NGTD x 2 4/28 BCx: Strep pneumo x 2 4/28 Sputum (TA): negative  4/28 MRSA PCR: negative  Thank you for allowing pharmacy to be a part of this patient's care.  Luisa HartChristy, Pamila Mendibles D 10/07/2015 1:01 PM

## 2015-10-07 NOTE — Progress Notes (Signed)
eLink Physician-Brief Progress Note Patient Name: Jonathon Bryant DOB: 01/30/1966 MRN: [id number]   Date of Service  10/07/2015  HPI/Events of Note  ABG on 60%/PRVC 16/TV 500/P 5 = 7.24/83/62/35.6. Rate increased to 26.  eICU Interventions  Will recheck ABG now.     Intervention Category Major Interventions: Respiratory failure - evaluation and management;Acid-Base disturbance - evaluation and management  Sommer,Steven Eugene 10/07/2015, 4:01 PM

## 2015-10-08 LAB — RENAL FUNCTION PANEL
ALBUMIN: 2.3 g/dL — AB (ref 3.5–5.0)
ALBUMIN: 1.9 g/dL — AB (ref 3.5–5.0)
ANION GAP: 15 (ref 5–15)
Albumin: 2 g/dL — ABNORMAL LOW (ref 3.5–5.0)
Albumin: 2.1 g/dL — ABNORMAL LOW (ref 3.5–5.0)
Albumin: 2.4 g/dL — ABNORMAL LOW (ref 3.5–5.0)
Anion gap: 13 (ref 5–15)
Anion gap: 16 — ABNORMAL HIGH (ref 5–15)
Anion gap: 16 — ABNORMAL HIGH (ref 5–15)
Anion gap: 15 (ref 5–15)
BUN: 108 mg/dL — ABNORMAL HIGH (ref 6–20)
BUN: 57 mg/dL — AB (ref 6–20)
BUN: 80 mg/dL — ABNORMAL HIGH (ref 6–20)
BUN: 87 mg/dL — ABNORMAL HIGH (ref 6–20)
BUN: 96 mg/dL — AB (ref 6–20)
CALCIUM: 8 mg/dL — AB (ref 8.9–10.3)
CHLORIDE: 102 mmol/L (ref 101–111)
CHLORIDE: 102 mmol/L (ref 101–111)
CHLORIDE: 105 mmol/L (ref 101–111)
CO2: 24 mmol/L (ref 22–32)
CO2: 23 mmol/L (ref 22–32)
CO2: 24 mmol/L (ref 22–32)
CO2: 27 mmol/L (ref 22–32)
CO2: 22 mmol/L (ref 22–32)
CREATININE: 2.69 mg/dL — AB (ref 0.61–1.24)
CREATININE: 2.7 mg/dL — AB (ref 0.61–1.24)
CREATININE: 2.82 mg/dL — AB (ref 0.61–1.24)
Calcium: 7.9 mg/dL — ABNORMAL LOW (ref 8.9–10.3)
Calcium: 8.1 mg/dL — ABNORMAL LOW (ref 8.9–10.3)
Calcium: 8.3 mg/dL — ABNORMAL LOW (ref 8.9–10.3)
Calcium: 7.1 mg/dL — ABNORMAL LOW (ref 8.9–10.3)
Chloride: 104 mmol/L (ref 101–111)
Chloride: 102 mmol/L (ref 101–111)
Creatinine, Ser: 2.72 mg/dL — ABNORMAL HIGH (ref 0.61–1.24)
Creatinine, Ser: 2.74 mg/dL — ABNORMAL HIGH (ref 0.61–1.24)
GFR calc Af Amer: 30 mL/min — ABNORMAL LOW (ref >60)
GFR calc Af Amer: 29 mL/min — ABNORMAL LOW (ref >60)
GFR calc Af Amer: 30 mL/min — ABNORMAL LOW (ref >60)
GFR calc non Af Amer: 25 mL/min — ABNORMAL LOW (ref >60)
GFR calc non Af Amer: 24 mL/min — ABNORMAL LOW (ref >60)
GFR, EST AFRICAN AMERICAN: 29 mL/min — AB (ref >60)
GFR, EST AFRICAN AMERICAN: 28 mL/min — AB (ref >60)
GFR, EST NON AFRICAN AMERICAN: 25 mL/min — AB (ref >60)
GFR, EST NON AFRICAN AMERICAN: 25 mL/min — AB (ref >60)
GFR, EST NON AFRICAN AMERICAN: 26 mL/min — AB (ref >60)
GLUCOSE: 144 mg/dL — AB (ref 65–99)
GLUCOSE: 107 mg/dL — AB (ref 65–99)
GLUCOSE: 96 mg/dL (ref 65–99)
Glucose, Bld: 176 mg/dL — ABNORMAL HIGH (ref 65–99)
Glucose, Bld: 128 mg/dL — ABNORMAL HIGH (ref 65–99)
PHOSPHORUS: 9.5 mg/dL — AB (ref 2.5–4.6)
POTASSIUM: 4.4 mmol/L (ref 3.5–5.1)
POTASSIUM: 4.7 mmol/L (ref 3.5–5.1)
Phosphorus: 8.2 mg/dL — ABNORMAL HIGH (ref 2.5–4.6)
Phosphorus: 8.1 mg/dL — ABNORMAL HIGH (ref 2.5–4.6)
Phosphorus: 8.3 mg/dL — ABNORMAL HIGH (ref 2.5–4.6)
Phosphorus: 8.6 mg/dL — ABNORMAL HIGH (ref 2.5–4.6)
Potassium: 4.8 mmol/L (ref 3.5–5.1)
Potassium: 5.3 mmol/L — ABNORMAL HIGH (ref 3.5–5.1)
Potassium: 4.9 mmol/L (ref 3.5–5.1)
SODIUM: 144 mmol/L (ref 135–145)
SODIUM: 140 mmol/L (ref 135–145)
Sodium: 145 mmol/L (ref 135–145)
Sodium: 142 mmol/L (ref 135–145)
Sodium: 139 mmol/L (ref 135–145)

## 2015-10-08 LAB — BLOOD GAS, ARTERIAL
ACID-BASE DEFICIT: 7.5 mmol/L — AB (ref 0.0–2.0)
ACID-BASE DEFICIT: 5.8 mmol/L — AB (ref 0.0–2.0)
ALLENS TEST (PASS/FAIL): POSITIVE — AB
Acid-base deficit: 6.8 mmol/L — ABNORMAL HIGH (ref 0.0–2.0)
Acid-base deficit: 7.5 mmol/L — ABNORMAL HIGH (ref 0.0–2.0)
Acid-base deficit: 6.7 mmol/L — ABNORMAL HIGH (ref 0.0–2.0)
Allens test (pass/fail): POSITIVE — AB
Allens test (pass/fail): POSITIVE — AB
Allens test (pass/fail): POSITIVE — AB
Bicarbonate: 27 meq/L (ref 21.0–28.0)
Bicarbonate: 23.9 mEq/L (ref 21.0–28.0)
Bicarbonate: 23.8 meq/L (ref 21.0–28.0)
Bicarbonate: 21.9 mEq/L (ref 21.0–28.0)
Bicarbonate: 22.2 mEq/L (ref 21.0–28.0)
FIO2: 0.8
FIO2: 0.8
FIO2: 0.8
FIO2: 0.8
FIO2: 0.8
LHR: 22 {breaths}/min
MECHVT: 500 mL
MECHVT: 600 mL
MECHVT: 600 mL
Mechanical Rate: 30
O2 SAT: 86 %
O2 SAT: 85.2 %
O2 Saturation: 89.9 %
O2 Saturation: 81.2 %
O2 Saturation: 89.3 %
PATIENT TEMPERATURE: 37
PATIENT TEMPERATURE: 37
PCO2 ART: 77 mmHg — AB (ref 32.0–48.0)
PCO2 ART: 60 mmHg — AB (ref 32.0–48.0)
PCO2 ART: 53 mmHg — AB (ref 32.0–48.0)
PEEP/CPAP: 15 cmH2O
PEEP: 15 cmH2O
PEEP: 5 cmH2O
PEEP: 10 cmH2O
PEEP: 10 cmH2O
PH ART: 7.17 — AB (ref 7.350–7.450)
PO2 ART: 70 mmHg — AB (ref 83.0–108.0)
Patient temperature: 37
Patient temperature: 37
Patient temperature: 37
RATE: 16 {breaths}/min
RATE: 22 {breaths}/min
RATE: 30 resp/min
VT: 600 mL
VT: 600 mL
pCO2 arterial: 102 mmHg (ref 32.0–48.0)
pCO2 arterial: 70 mmHg (ref 32.0–48.0)
pH, Arterial: 7.03 — CL (ref 7.350–7.450)
pH, Arterial: 7.1 — CL (ref 7.350–7.450)
pH, Arterial: 7.14 — CL (ref 7.350–7.450)
pH, Arterial: 7.23 — ABNORMAL LOW (ref 7.350–7.450)
pO2, Arterial: 84 mmHg (ref 83.0–108.0)
pO2, Arterial: 60 mmHg — ABNORMAL LOW (ref 83.0–108.0)
pO2, Arterial: 64 mmHg — ABNORMAL LOW (ref 83.0–108.0)
pO2, Arterial: 68 mmHg — ABNORMAL LOW (ref 83.0–108.0)

## 2015-10-08 LAB — CBC
HCT: 39.7 % — ABNORMAL LOW (ref 40.0–52.0)
HCT: 42.3 % (ref 40.0–52.0)
Hemoglobin: 13 g/dL (ref 13.0–18.0)
Hemoglobin: 13.1 g/dL (ref 13.0–18.0)
MCH: 34.3 pg — ABNORMAL HIGH (ref 26.0–34.0)
MCH: 33.7 pg (ref 26.0–34.0)
MCHC: 32.8 g/dL (ref 32.0–36.0)
MCHC: 30.9 g/dL — ABNORMAL LOW (ref 32.0–36.0)
MCV: 104.6 fL — ABNORMAL HIGH (ref 80.0–100.0)
MCV: 109.1 fL — AB (ref 80.0–100.0)
PLATELETS: 185 10*3/uL (ref 150–440)
PLATELETS: 198 10*3/uL (ref 150–440)
RBC: 3.79 MIL/uL — ABNORMAL LOW (ref 4.40–5.90)
RBC: 3.88 MIL/uL — AB (ref 4.40–5.90)
RDW: 16.6 % — AB (ref 11.5–14.5)
RDW: 17.5 % — ABNORMAL HIGH (ref 11.5–14.5)
WBC: 54.7 10*3/uL (ref 3.8–10.6)
WBC: 70.1 10*3/uL (ref 3.8–10.6)

## 2015-10-08 LAB — GLUCOSE, CAPILLARY
GLUCOSE-CAPILLARY: 152 mg/dL — AB (ref 65–99)
GLUCOSE-CAPILLARY: 71 mg/dL (ref 65–99)
Glucose-Capillary: 136 mg/dL — ABNORMAL HIGH (ref 65–99)
Glucose-Capillary: 135 mg/dL — ABNORMAL HIGH (ref 65–99)
Glucose-Capillary: 100 mg/dL — ABNORMAL HIGH (ref 65–99)
Glucose-Capillary: 121 mg/dL — ABNORMAL HIGH (ref 65–99)
Glucose-Capillary: 87 mg/dL (ref 65–99)

## 2015-10-08 LAB — COMPREHENSIVE METABOLIC PANEL
ALBUMIN: 2.1 g/dL — AB (ref 3.5–5.0)
ALT: 529 U/L — AB (ref 17–63)
ANION GAP: 16 — AB (ref 5–15)
AST: 1456 U/L — ABNORMAL HIGH (ref 15–41)
Alkaline Phosphatase: 119 U/L (ref 38–126)
BUN: 65 mg/dL — ABNORMAL HIGH (ref 6–20)
CHLORIDE: 100 mmol/L — AB (ref 101–111)
CO2: 22 mmol/L (ref 22–32)
CREATININE: 2.63 mg/dL — AB (ref 0.61–1.24)
Calcium: 7.6 mg/dL — ABNORMAL LOW (ref 8.9–10.3)
GFR calc non Af Amer: 26 mL/min — ABNORMAL LOW (ref >60)
GFR, EST AFRICAN AMERICAN: 30 mL/min — AB (ref >60)
GLUCOSE: 139 mg/dL — AB (ref 65–99)
Potassium: 4.9 mmol/L (ref 3.5–5.1)
SODIUM: 138 mmol/L (ref 135–145)
Total Bilirubin: 4.4 mg/dL — ABNORMAL HIGH (ref 0.3–1.2)
Total Protein: 6.8 g/dL (ref 6.5–8.1)

## 2015-10-08 LAB — DIFFERENTIAL
BAND NEUTROPHILS: 7 %
BASOS ABS: 0 10*3/uL (ref 0–0.1)
Basophils Relative: 0 %
Blasts: 0 %
EOS ABS: 0 10*3/uL (ref 0–0.7)
Eosinophils Relative: 0 %
LYMPHS ABS: 4.2 10*3/uL — AB (ref 1.0–3.6)
Lymphocytes Relative: 6 %
METAMYELOCYTES PCT: 5 %
MONO ABS: 0.7 10*3/uL (ref 0.2–1.0)
Monocytes Relative: 1 %
Myelocytes: 2 %
NEUTROS ABS: 55.4 10*3/uL — AB (ref 1.4–6.5)
Neutrophils Relative %: 79 %
Other: 0 %
Promyelocytes Absolute: 0 %
nRBC: 8 /100 WBC — ABNORMAL HIGH

## 2015-10-08 LAB — HEPARIN LEVEL (UNFRACTIONATED)
Heparin Unfractionated: 0.74 IU/mL — ABNORMAL HIGH (ref 0.30–0.70)
Heparin Unfractionated: 0.69 IU/mL (ref 0.30–0.70)
Heparin Unfractionated: 0.43 IU/mL (ref 0.30–0.70)

## 2015-10-08 LAB — LACTIC ACID, PLASMA
LACTIC ACID, VENOUS: 5.5 mmol/L — AB (ref 0.5–2.0)
Lactic Acid, Venous: 7.3 mmol/L (ref 0.5–2.0)

## 2015-10-08 LAB — AMMONIA: AMMONIA: 109 umol/L — AB (ref 9–35)

## 2015-10-08 MED ORDER — METHYLPREDNISOLONE SODIUM SUCC 40 MG IJ SOLR: 20.0000 mg | Freq: Every day | INTRAMUSCULAR | Status: DC

## 2015-10-08 MED ORDER — VITAL HIGH PROTEIN PO LIQD: 1000.0000 mL | ORAL | Status: DC

## 2015-10-08 MED ORDER — SODIUM BICARBONATE 8.4 % IV SOLN
50.0000 meq | INTRAVENOUS | Status: AC
  Administered 2015-10-08: 50 meq via INTRAVENOUS

## 2015-10-08 MED ORDER — METRONIDAZOLE IN NACL 5-0.79 MG/ML-% IV SOLN
500.0000 mg | Freq: Three times a day (TID) | INTRAVENOUS | Status: DC
  Administered 2015-10-08 – 2015-10-09 (×3): 500 mg via INTRAVENOUS
  Filled 2015-10-08 (×5): qty 100

## 2015-10-08 MED ORDER — HYDROCORTISONE NA SUCCINATE PF 100 MG IJ SOLR
50.0000 mg | Freq: Four times a day (QID) | INTRAMUSCULAR | Status: DC
  Administered 2015-10-08 – 2015-10-09 (×4): 50 mg via INTRAVENOUS
  Filled 2015-10-08 (×4): qty 2

## 2015-10-08 MED ORDER — DEXTROSE 5 % IV SOLN
2.0000 ug/min | INTRAVENOUS | Status: DC
  Administered 2015-10-08: 4 ug/min via INTRAVENOUS
  Filled 2015-10-08 (×3): qty 16

## 2015-10-08 MED ORDER — VANCOMYCIN 50 MG/ML ORAL SOLUTION
125.0000 mg | Freq: Four times a day (QID) | ORAL | Status: DC
  Administered 2015-10-08 – 2015-10-09 (×3): 125 mg via ORAL
  Filled 2015-10-08 (×9): qty 2.5

## 2015-10-08 MED ORDER — VASOPRESSIN 20 UNIT/ML IV SOLN
0.0300 [IU]/min | INTRAVENOUS | Status: DC
  Administered 2015-10-08: 0.03 [IU]/min via INTRAVENOUS
  Filled 2015-10-08 (×2): qty 2

## 2015-10-08 MED ORDER — SODIUM CHLORIDE 0.9 % IV BOLUS (SEPSIS)
1000.0000 mL | Freq: Once | INTRAVENOUS | Status: AC
  Administered 2015-10-08: 1000 mL via INTRAVENOUS

## 2015-10-08 MED ORDER — SODIUM BICARBONATE 8.4 % IV SOLN
INTRAVENOUS | Status: AC
  Administered 2015-10-08: 100 meq via INTRAVENOUS
  Filled 2015-10-08: qty 100

## 2015-10-08 MED ORDER — VECURONIUM BROMIDE 10 MG IV SOLR
20.0000 mg | Freq: Once | INTRAVENOUS | Status: AC
  Administered 2015-10-08: 20 mg via INTRAVENOUS

## 2015-10-08 MED ORDER — SODIUM CHLORIDE 0.9 % IV SOLN
Freq: Once | INTRAVENOUS | Status: AC
  Administered 2015-10-08: 13:00:00 via INTRAVENOUS

## 2015-10-08 MED ORDER — METOCLOPRAMIDE HCL 5 MG/5ML PO SOLN
7.5000 mg | Freq: Four times a day (QID) | ORAL | Status: DC
  Administered 2015-10-08 (×2): 7.5 mg via ORAL
  Filled 2015-10-08 (×7): qty 10

## 2015-10-08 MED ORDER — SODIUM BICARBONATE 8.4 % IV SOLN
INTRAVENOUS | Status: DC
  Administered 2015-10-08 – 2015-10-09 (×2): via INTRAVENOUS
  Filled 2015-10-08 (×4): qty 850

## 2015-10-08 MED ORDER — SODIUM CHLORIDE 0.9 % IV SOLN
2.0000 mg/h | INTRAVENOUS | Status: DC
  Administered 2015-10-08: 0.5 mg/h via INTRAVENOUS
  Filled 2015-10-08: qty 10

## 2015-10-08 MED ORDER — NOREPINEPHRINE BITARTRATE 1 MG/ML IV SOLN: 2.0000 ug/min | INTRAVENOUS | Status: DC

## 2015-10-08 MED ORDER — VECURONIUM BROMIDE 10 MG IV SOLR
20.0000 mg | INTRAVENOUS | Status: DC | PRN
  Administered 2015-10-08 – 2015-10-09 (×3): 20 mg via INTRAVENOUS
  Filled 2015-10-08 (×3): qty 20

## 2015-10-08 MED ORDER — ARTIFICIAL TEARS OP OINT
1.0000 "application " | TOPICAL_OINTMENT | Freq: Three times a day (TID) | OPHTHALMIC | Status: DC
  Administered 2015-10-08 – 2015-10-09 (×4): 1 via OPHTHALMIC
  Filled 2015-10-08: qty 3.5

## 2015-10-08 MED ORDER — PROPOFOL 1000 MG/100ML IV EMUL
25.0000 ug/kg/min | INTRAVENOUS | Status: DC
  Administered 2015-10-08: 8 ug/kg/min via INTRAVENOUS
  Filled 2015-10-08: qty 100

## 2015-10-08 MED ORDER — NOREPINEPHRINE 4 MG/250ML-% IV SOLN: 0.0000 ug/min | INTRAVENOUS | Status: DC

## 2015-10-08 MED ORDER — SODIUM CHLORIDE 0.9 % IV SOLN
0.8000 ug/kg/min | INTRAVENOUS | Status: DC
  Filled 2015-10-08: qty 100

## 2015-10-08 MED ORDER — SODIUM BICARBONATE 8.4 % IV SOLN
INTRAVENOUS | Status: AC
  Administered 2015-10-08: 50 meq via INTRAVENOUS
  Filled 2015-10-08: qty 100

## 2015-10-08 MED ORDER — PHENYLEPHRINE HCL 10 MG/ML IJ SOLN
30.0000 ug/min | INTRAMUSCULAR | Status: DC
  Administered 2015-10-08: 100 ug/min via INTRAVENOUS
  Administered 2015-10-08: 160 ug/min via INTRAVENOUS
  Administered 2015-10-09 (×3): 170 ug/min via INTRAVENOUS
  Filled 2015-10-08 (×7): qty 4

## 2015-10-08 MED ORDER — SODIUM BICARBONATE 8.4 % IV SOLN
100.0000 meq | Freq: Once | INTRAVENOUS | Status: AC
  Administered 2015-10-08: 100 meq via INTRAVENOUS

## 2015-10-08 MED ORDER — VECURONIUM BROMIDE 10 MG IV SOLR
10.0000 mg | INTRAVENOUS | Status: DC | PRN
  Filled 2015-10-08: qty 20

## 2015-10-08 NOTE — Progress Notes (Signed)
Dr. Belia HemanKasa at bedside placing right femoral arterial line.

## 2015-10-08 NOTE — Progress Notes (Signed)
[facility]  ROUNDING NOTE   Subjective:  Patient was started on continuous renal placement therapy at request of pulmonary critical care for worsening azotemia. Patient remains critically ill at this point in time. BUN still high at 108 but a bit lower than yesterday.    Objective:  Vital signs in last 24 hours:  Temp:  [96.3 F (35.7 C)-100.4 F (38 C)] 99.3 F (37.4 C) (05/05 0800) Pulse Rate:  [58-152] 80 (05/05 0800) Resp:  [11-30] 12 (05/05 0800) BP: (81-197)/(42-121) 111/53 mmHg (05/05 0800) SpO2:  [86 %-96 %] 89 % (05/05 0800) FiO2 (%):  [40 %-100 %] 60 % (05/05 0800) Weight:  [135.5 kg (298 lb 11.6 oz)] 135.5 kg (298 lb 11.6 oz) (05/05 0448)  Weight change: 2.5 kg (5 lb 8.2 oz) Filed Weights   10/07/15 0400 10/07/15 0405 10/08/15 0448  Weight: 133 kg (293 lb 3.4 oz) 133 kg (293 lb 3.4 oz) 135.5 kg (298 lb 11.6 oz)    Intake/Output: I/O last 3 completed shifts: In: 7157 [I.V.:3734; RC/BU:3845; IV Piggyback:1300] Out: 4601 [XMIWO:0321; YYQMG:5003]   Intake/Output this shift:  Total I/O In: 211.9 [I.V.:151.9; NG/GT:60] Out: 5 [Urine:5]  Physical Exam: General: Critically ill appearing  Head: Normocephalic, atraumatic. ETT/OG in place  Eyes: Anicteric  Neck: Supple, trachea midline  Lungs:  Coarse BS b/l, vent assisted  Heart: S1S2 no rubs  Abdomen:  Soft, nontender, BS present   Extremities: 1+ peripheral edema.  Neurologic: Not following commands  Skin: Rash b/l UEs  Access:     Basic Metabolic Panel:  Recent Labs Lab 10/07/15 0753 10/07/15 1725 10/07/15 1937 10/07/15 2356 10/08/15 0426  NA 144 147* 145 145 144  K 3.9 5.5* 5.1 4.8 4.4  CL 101 103 104 105 104  CO2 29 32 28 27 24   GLUCOSE 212* 212* 227* 176* 144*  BUN 115* 129* 123* 96* 108*  CREATININE 2.17* 2.52* 2.74* 2.69* 2.72*  CALCIUM 8.4* 8.2* 7.8* 7.9* 8.1*  MG 2.7*  --   --   --   --   PHOS 4.2 9.0* 8.0* 8.2* 8.1*    Liver Function Tests:  Recent Labs Lab  09/05/2015 1115 10/02/15 0505 10/03/15 0425 10/04/15 0548 10/07/15 1725 10/07/15 1937 10/07/15 2356 10/08/15 0426  AST 91* 65* 113* 133*  --   --   --   --   ALT 46 38 44 47  --   --   --   --   ALKPHOS 63 39 61 80  --   --   --   --   BILITOT 6.0* 2.7* 2.5* 2.9*  --   --   --   --   PROT 7.3 5.9* 5.9* 5.8*  --   --   --   --   ALBUMIN 3.3* 2.6* 2.5* 2.3* 2.0* 1.8* 2.0* 2.1*    Recent Labs Lab 09/10/2015 1115  LIPASE 31    Recent Labs Lab 09/17/2015 1154  AMMONIA 77*    CBC:  Recent Labs Lab 10/02/2015 1115  10/03/15 0425 10/04/15 0548 10/06/15 0510 10/07/15 0753 10/08/15 0426  WBC 27.8*  < > 16.3* 16.2* 20.1* 26.0* 54.7*  NEUTROABS 26.4*  --   --   --   --   --   --   HGB 14.1  < > 13.0 12.9* 13.1 13.1 13.0  HCT 42.9  < > 39.0* 37.9* 38.4* 39.0* 39.7*  MCV 103.5*  < > 103.3* 102.1* 104.0* 101.7* 104.6*  PLT 63*  < >  84* 89* 118* 129* 185  < > = values in this interval not displayed.  Cardiac Enzymes:  Recent Labs Lab 09/24/2015 1115 10/02/15 0505 10/03/15 0425  TROPONINI 0.05* 0.14* 0.12*    BNP: Invalid input(s): POCBNP  CBG:  Recent Labs Lab 10/07/15 1608 10/07/15 1941 10/08/15 0002 10/08/15 0405 10/08/15 0754  GLUCAP 201* 210* 152* 136* 135*    Microbiology: Results for orders placed or performed during the hospital encounter of 09/04/2015  Blood culture (routine x 2)     Status: Abnormal   Collection Time: 09/24/2015 11:15 AM  Result Value Ref Range Status   Specimen Description BLOOD RIGHT ANTECUBITAL  Final   Special Requests BOTTLES DRAWN AEROBIC AND ANAEROBIC 1CC  Final   Culture  Setup Time   Final    GRAM POSITIVE COCCI IN BOTH AEROBIC AND ANAEROBIC BOTTLES CRITICAL RESULT CALLED TO, READ BACK BY AND VERIFIED WITH: NATE COOKSON AT 0154 10/02/15.PMH CONFIRMED BY RWW    Culture (A)  Final    STREPTOCOCCUS PNEUMONIAE IN BOTH AEROBIC AND ANAEROBIC BOTTLES    Report Status 10/04/2015 FINAL  Final   Organism ID, Bacteria STREPTOCOCCUS  PNEUMONIAE  Final      Susceptibility   Streptococcus pneumoniae - MIC*    PENICILLIN Value in next row       <=0.06SENSITIVE    ERYTHROMYCIN Value in next row Sensitive      <=0.06SENSITIVE    LEVOFLOXACIN Value in next row Sensitive      <=0.06SENSITIVE    VANCOMYCIN Value in next row Sensitive      <=0.06SENSITIVE    CEFTRIAXONE Value in next row Sensitive      <=0.06SENSITIVE    * STREPTOCOCCUS PNEUMONIAE  Blood Culture ID Panel (Reflexed)     Status: Abnormal   Collection Time: 09/24/2015 11:15 AM  Result Value Ref Range Status   Enterococcus species NOT DETECTED NOT DETECTED Final   Vancomycin resistance NOT DETECTED NOT DETECTED Final   Listeria monocytogenes NOT DETECTED NOT DETECTED Final   Staphylococcus species NOT DETECTED NOT DETECTED Final   Staphylococcus aureus NOT DETECTED NOT DETECTED Final   Methicillin resistance NOT DETECTED NOT DETECTED Final   Streptococcus species DETECTED (A) NOT DETECTED Final    Comment: CRITICAL RESULT CALLED TO, READ BACK BY AND VERIFIED WITH: NATE COOKSON AT 0154 10/02/15.PMH    Streptococcus agalactiae NOT DETECTED NOT DETECTED Final   Streptococcus pneumoniae DETECTED (A) NOT DETECTED Final    Comment: CRITICAL RESULT CALLED TO, READ BACK BY AND VERIFIED WITH: NATE COOKSON AT 0154 10/02/15.PMH    Streptococcus pyogenes NOT DETECTED NOT DETECTED Final   Acinetobacter baumannii NOT DETECTED NOT DETECTED Final   Enterobacteriaceae species NOT DETECTED NOT DETECTED Final   Enterobacter cloacae complex NOT DETECTED NOT DETECTED Final   Escherichia coli NOT DETECTED NOT DETECTED Final   Klebsiella oxytoca NOT DETECTED NOT DETECTED Final   Klebsiella pneumoniae NOT DETECTED NOT DETECTED Final   Proteus species NOT DETECTED NOT DETECTED Final   Serratia marcescens NOT DETECTED NOT DETECTED Final   Carbapenem resistance NOT DETECTED NOT DETECTED Final   Haemophilus influenzae NOT DETECTED NOT DETECTED Final   Neisseria meningitidis NOT  DETECTED NOT DETECTED Final   Pseudomonas aeruginosa NOT DETECTED NOT DETECTED Final   Candida albicans NOT DETECTED NOT DETECTED Final   Candida glabrata NOT DETECTED NOT DETECTED Final   Candida krusei NOT DETECTED NOT DETECTED Final   Candida parapsilosis NOT DETECTED NOT DETECTED Final   Candida tropicalis NOT  DETECTED NOT DETECTED Final  Blood culture (routine x 2)     Status: Abnormal   Collection Time: 09/11/2015 11:54 AM  Result Value Ref Range Status   Specimen Description BLOOD RIGHT FOREARM  Final   Special Requests BOTTLES DRAWN AEROBIC AND ANAEROBIC 2CC  Final   Culture  Setup Time   Final    GRAM POSITIVE COCCI IN BOTH AEROBIC AND ANAEROBIC BOTTLES CRITICAL RESULT CALLED TO, READ BACK BY AND VERIFIED WITH: NATE COOKSON AT 0154 10/02/15.PMH CONFIRMED BY RWW    Culture (A)  Final    STREPTOCOCCUS PNEUMONIAE IN BOTH AEROBIC AND ANAEROBIC BOTTLES    Report Status 10/05/2015 FINAL  Final   Organism ID, Bacteria STREPTOCOCCUS PNEUMONIAE  Final      Susceptibility   Streptococcus pneumoniae - MIC*    ERYTHROMYCIN <=0.12 SENSITIVE Sensitive     LEVOFLOXACIN 1 SENSITIVE Sensitive     VANCOMYCIN 0.5 SENSITIVE Sensitive     * STREPTOCOCCUS PNEUMONIAE  MRSA PCR Screening     Status: None   Collection Time: 10/03/2015  6:31 PM  Result Value Ref Range Status   MRSA by PCR NEGATIVE NEGATIVE Final    Comment:        The GeneXpert MRSA Assay (FDA approved for NASAL specimens only), is one component of a comprehensive MRSA colonization surveillance program. It is not intended to diagnose MRSA infection nor to guide or monitor treatment for MRSA infections.   Culture, respiratory (tracheal aspirate)     Status: None   Collection Time: 09/26/2015  8:02 PM  Result Value Ref Range Status   Specimen Description TRACHEAL ASPIRATE  Final   Special Requests NONE  Final   Gram Stain MANY WBC SEEN RARE GRAM POSITIVE COCCI   Final   Culture NO GROWTH 3 DAYS  Final   Report Status  10/04/2015 FINAL  Final  CULTURE, BLOOD (ROUTINE X 2) w Reflex to PCR ID Panel     Status: None (Preliminary result)   Collection Time: 10/06/15  3:16 PM  Result Value Ref Range Status   Specimen Description BLOOD LEFT ASSIST CONTROL  Final   Special Requests   Final    BOTTLES DRAWN AEROBIC AND ANAEROBIC  AERO 10CC ANA 5CC   Culture NO GROWTH < 24 HOURS  Final   Report Status PENDING  Incomplete  CULTURE, BLOOD (ROUTINE X 2) w Reflex to PCR ID Panel     Status: None (Preliminary result)   Collection Time: 10/06/15  3:37 PM  Result Value Ref Range Status   Specimen Description BLOOD RIGHT ASSIST CONTROL  Final   Special Requests   Final    BOTTLES DRAWN AEROBIC AND ANAEROBIC AERO 10CC ANA [place]   Culture NO GROWTH < 24 HOURS  Final   Report Status PENDING  Incomplete  Culture, expectorated sputum-assessment     Status: None   Collection Time: 10/07/15 12:34 PM  Result Value Ref Range Status   Specimen Description TRACHEAL ASPIRATE  Final   Special Requests Normal  Final   Sputum evaluation THIS SPECIMEN IS ACCEPTABLE FOR SPUTUM CULTURE  Final   Report Status 10/07/2015 FINAL  Final    Coagulation Studies: No results for input(s): LABPROT, INR in the last 72 hours.  Urinalysis: No results for input(s): COLORURINE, LABSPEC, PHURINE, GLUCOSEU, HGBUR, BILIRUBINUR, KETONESUR, PROTEINUR, UROBILINOGEN, NITRITE, LEUKOCYTESUR in the last 72 hours.  Invalid input(s): APPERANCEUR    Imaging: Ct Abdomen Pelvis Wo Contrast  10/07/2015  CLINICAL DATA:  Fever, acidosis.  Sepsis. EXAM: CT ABDOMEN AND PELVIS WITHOUT CONTRAST TECHNIQUE: Multidetector CT imaging of the abdomen and pelvis was performed following the standard protocol without IV contrast. COMPARISON:  CT 08/30/2014 FINDINGS: Lower chest: There is bibasilar consolidated airspace disease and small effusions. Hepatobiliary: This nodular contour of the liver. Recanalization of the umbilical veins. Postcholecystectomy. Pancreas: No  pancreatic inflammation Spleen: Spleen is minimally enlarged. Adrenal glands are normal. The kidneys, ureters and bladder normal. Adrenals/urinary tract: Adrenal glands and kidneys are normal. Foley catheter within the bladder. Stomach/Bowel: NG tube extends into the stomach. The duodenum, small-bowel, cecum normal. Appendix not identified. The colon contains fluid stool. No obstructing lesion. No evidence of acute inflammation. Rectum normal. Vascular/Lymphatic: Abdominal aorta is normal caliber with atherosclerotic calcification. There is no retroperitoneal or periportal lymphadenopathy. No pelvic lymphadenopathy. Reproductive: Prostate normal.  Foley catheter within the bladder. Other: No free fluid. Musculoskeletal: No aggressive osseous lesion. IMPRESSION: 1. Bibasilar airspace disease concerning for pneumonia versus aspiration pneumonitis. Small effusions. 2. Nodular liver with mild enlarged spleen consistent with cirrhosis and portal hypertension. 3. Fluid stool in the colon. 4. No abdominal or pelvic abscess. Electronically Signed   By: Suzy Bouchard M.D.   On: 10/07/2015 15:38   Ct Head Wo Contrast  10/07/2015  CLINICAL DATA:  Unresponsive patient. Fever, acidosis. Altered mental status. Severe sepsis. EXAM: CT HEAD WITHOUT CONTRAST TECHNIQUE: Contiguous axial images were obtained from the base of the skull through the vertex without intravenous contrast. COMPARISON:  Brain CT 04/19/2012. FINDINGS: Ventricles and sulci are appropriate for patient's age. No evidence for acute cortically based infarct, intracranial hemorrhage, mass lesion or mass effect. Orbits are unremarkable. Paranasal sinuses are well aerated. Mastoid air cells are well aerated. Calvarium is intact. IMPRESSION: No acute intracranial process. Electronically Signed   By: Lovey Newcomer M.D.   On: 10/07/2015 15:18   [place] Chest  10/06/2015  CLINICAL DATA:  50 year old male with a history of small pleural effusion on CT and chest x-ray, with  diagnosis of pneumococcal pneumonia. He has been referred for potential thoracentesis. Patient is ICU status on a ventilator. EXAM: CHEST ULTRASOUND COMPARISON:  CT 10/05/2015, chest x-ray 10/06/2015 FINDINGS: Ultrasound survey of the right chest for possible thoracentesis demonstrates small pleural fluid without complex features. Ultrasound-guided thoracentesis was deferred at this time given the small size. IMPRESSION: Small, layering right pleural effusion, likely parapneumonic. Signed, Dulcy Fanny. Earleen Newport, DO [facility] Electronically Signed   By: Corrie Mckusick D.O.   On: 10/06/2015 12:31   US Renal  10/07/2015  CLINICAL DATA:  Acute renal failure EXAM: RENAL / URINARY TRACT ULTRASOUND COMPLETE COMPARISON:  CT scan 10/07/2015 FINDINGS: Right Kidney: Length: 13.6 cm in length. Echogenicity within normal limits. No mass or hydronephrosis visualized. Left Kidney: Length: 13 cm in length. Limited visualization due to patient's large body habitus. Echogenicity within normal limits. No mass or hydronephrosis visualized. Bladder: Urinary bladder is not visualized decompressed by Foley catheter. IMPRESSION: No renal mass or hydronephrosis. No nephrolithiasis. The urinary bladder is not visualized decompressed by Foley catheter. Electronically Signed   By: Lahoma Crocker M.D.   On: 10/07/2015 17:04   US Venous Img Lower Bilateral  10/07/2015  CLINICAL DATA:  Fever of unknown origin. History of smoking. Evaluate for DVT. EXAM: BILATERAL LOWER EXTREMITY VENOUS DOPPLER ULTRASOUND TECHNIQUE: Gray-scale sonography with graded compression, as well as color Doppler and duplex ultrasound were performed to evaluate the lower extremity deep venous systems from the level of the common femoral  vein and including the common femoral, femoral, profunda femoral, popliteal and calf veins including the posterior tibial, peroneal and gastrocnemius veins when visible. The superficial  great saphenous vein was also interrogated. Spectral Doppler was utilized to evaluate flow at rest and with distal augmentation maneuvers in the common femoral, femoral and popliteal veins. COMPARISON:  None. FINDINGS: RIGHT LOWER EXTREMITY Common Femoral Vein: No evidence of thrombus. Normal compressibility, respiratory phasicity and response to augmentation. Saphenofemoral Junction: No evidence of thrombus. Normal compressibility and flow on color Doppler imaging. Profunda Femoral Vein: No evidence of thrombus. Normal compressibility and flow on color Doppler imaging. Femoral Vein: No evidence of thrombus. Normal compressibility, respiratory phasicity and response to augmentation. Popliteal Vein: No evidence of thrombus. Normal compressibility, respiratory phasicity and response to augmentation. Calf Veins: No evidence of thrombus. Normal compressibility and flow on color Doppler imaging. Superficial Great Saphenous Vein: No evidence of thrombus. Normal compressibility and flow on color Doppler imaging. Venous Reflux:  None. Other Findings:  None. LEFT LOWER EXTREMITY Common Femoral Vein: No evidence of thrombus. Normal compressibility, respiratory phasicity and response to augmentation. Saphenofemoral Junction: No evidence of thrombus. Normal compressibility and flow on color Doppler imaging. Profunda Femoral Vein: No evidence of thrombus. Normal compressibility and flow on color Doppler imaging. Femoral Vein: No evidence of thrombus. Normal compressibility, respiratory phasicity and response to augmentation. Popliteal Vein: No evidence of thrombus. Normal compressibility, respiratory phasicity and response to augmentation. Calf Veins: No evidence of thrombus. Normal compressibility and flow on color Doppler imaging. Superficial Great Saphenous Vein: No evidence of thrombus. Normal compressibility and flow on color Doppler imaging. Venous Reflux:  None. Other Findings:  None. IMPRESSION: No evidence of DVT within  either lower extremity. Electronically Signed   By: Sandi Mariscal M.D.   On: 10/07/2015 17:05   Dg Chest Port 1 View  10/07/2015  CLINICAL DATA:  Patient status post central line placement. EXAM: PORTABLE CHEST 1 VIEW COMPARISON:  Chest radiograph 10/07/2015. FINDINGS: Right IJ central venous catheter tip projects over the superior vena cava. ET tube terminates in the mid trachea. Enteric tube courses inferior to the diaphragm. Monitoring leads overlie the patient. New dual-lumen central venous catheter is present with tip projecting over the superior vena cava. Low lung volumes. Stable cardiomegaly. Diffuse bilateral predominately perihilar interstitial opacities and bibasilar airspace opacities. No pleural effusion or pneumothorax. IMPRESSION: New dual-lumen central venous catheter tip projects over the superior vena cava. Otherwise stable support apparatus. Low lung volumes, cardiomegaly, edema and basilar atelectasis. Electronically Signed   By: Lovey Newcomer M.D.   On: 10/07/2015 16:15   Dg Chest Port 1 View  10/07/2015  CLINICAL DATA:  Hypoxia EXAM: PORTABLE CHEST 1 VIEW COMPARISON:  Oct 06, 2015 FINDINGS: Endotracheal tube tip is 4.4 cm above the carina. Central catheter tip is in the superior vena cava. Nasogastric tube tip and side port are below the diaphragm. No pneumothorax. There is patchy airspace disease in both lower lobes, slightly increased bilaterally. There is stable mild cardiomegaly. The pulmonary vascular is within normal limits. No adenopathy evident. IMPRESSION: Tube and catheter positions as described without pneumothorax. Stable cardiac silhouette. Increase in airspace consolidation in both lower lobes compared to 1 day prior. Electronically Signed   By: Lowella Grip III M.D.   On: 10/07/2015 12:21     Medications:   . sodium chloride 75 mL/hr at 10/08/15 0511  . feeding supplement (VITAL HIGH PROTEIN) Stopped (10/08/15 0800)  . heparin 1,550 Units/hr (10/07/15 2329)  .  HYDROmorphone 2 mg/hr (10/07/15 2258)  . midazolam (VERSED) infusion Stopped (10/07/15 1345)  . phenylephrine (NEO-SYNEPHRINE) Adult infusion 34 mcg/min (10/08/15 0520)  . propofol (DIPRIVAN) infusion 8 mcg/kg/min (10/08/15 0520)  . pureflow 3 each (10/08/15 0703)   . antiseptic oral rinse  7 mL Mouth Rinse QID  . aspirin  81 mg Per Tube Daily  . budesonide (PULMICORT) nebulizer solution  0.5 mg Nebulization BID  . chlorhexidine gluconate (SAGE KIT)  15 mL Mouth Rinse BID  . feeding supplement (PRO-STAT SUGAR FREE 64)  30 mL Per Tube TID  . folic acid  1 mg Per Tube Daily  . free water  100 mL Per Tube Q8H  . insulin aspart  2-6 Units Subcutaneous Q4H  . ipratropium-albuterol  3 mL Nebulization Q4H  . lactulose  30 g Per Tube TID  . methylPREDNISolone (SOLU-MEDROL) injection  40 mg Intravenous Q12H  . metoprolol tartrate  25 mg Per Tube BID  . pantoprazole sodium  40 mg Per Tube Q1200  . piperacillin-tazobactam  4.5 g Intravenous Q8H  . sodium chloride flush  10-40 mL Intracatheter Q12H  . vancomycin  1,000 mg Intravenous Q24H   sodium chloride, acetaminophen, bisacodyl, fentaNYL, heparin, metoprolol, midazolam, sennosides, vecuronium  Assessment/ Plan:  50 y.o. 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  1. acute renal failure secondary to acute tubular necrosis from sepsis. Patient presented with underlying sepsis from Streptococcus pneumonia. Additional contributors to azotemia including use of steroids and high protein tube feeds. Ibuprofen also used for high fevers. - Yesterday we were asked to start continuous renal placement therapy given worsening azotemia. Therefore CRRT was started and patient appears to be tolerating well. We will continue this now for another 24 hours. The patient is producing some urine. We will continue ultrafiltration target of 2.4 kg over 24  hours.  2. Respiratory failure secondary to pneumonia. Patient has Streptococcus pneumonia bacteremia and pneumonia.  -  PCO2 still quite high at 82. Continue mechanical ventilation per pulmonary critical care. We are also attempting ultrafiltration to keep a dry lung strategy.  3. Sepsis with Streptococcus pneumoniae septicemia A40.3  continue penicillin G 4.5 g intravenous every 8 hours.   LOS: 7 Mcclellan Demarais 5/5/20178:33 AM

## 2015-10-08 NOTE — Progress Notes (Signed)
Patient decompensating.  Hypotensive, decreased heart rate from 70's to 60's, frequent PVC's and  increased work of breathing.  Dr. Belia HemanKasa notified.  Order obtained for normal saline bolus.  PRN vecuronium given, NS bolus initiated, patient placed in reverse trendelenburg, neo synephrine titrated up.

## 2015-10-08 NOTE — Progress Notes (Signed)
Nutrition Follow-up  DOCUMENTATION CODES:   Morbid obesity  INTERVENTION:  -Discussed nutritional poc during ICU rounds with MD Kasa and ICU team; CT abdomen negative, abdomen soft. Discussed possible addition of promotilty agent such as reglan or erythromycin; MD gave order to start reglan today. MD also agreeable to restart TF; pt now on CRRT, recommend continuing current TF formula but with addition of diprivan, recommend decreasing goal rate. Recommend goal of 40 ml/hr, continue Prostat TID. Reassess on follow-up  -Liquid stool noted in colon per CT abdomen report; discussed during ICU rounds, pt receiving lactulose. Fleet Contrasachel RN plans to check pt for impaction today.    NUTRITION DIAGNOSIS:   Inadequate oral intake related to acute illness as evidenced by NPO status, per patient/family report.  Being addressed via TF  GOAL:   Provide needs based on ASPEN/SCCM guidelines  MONITOR:   TF tolerance, Vent status, Labs, I & O's, Weight trends  REASON FOR ASSESSMENT:   Ventilator, Consult Enteral/tube feeding initiation and management  ASSESSMENT:   50 yo male admitted with severe sepsis, acute respiratory failure with suspected pneumonia, hx of NASH. Pt with respiratory distressed on admission, initially placed on BiPap but required intubation 10/02/2015  Pt remains on vent support, started on CRRT yesterday, WBC continues trend up (54.7 today, 26.0 yesterday), fevers improved (99 today), pt now on neosynephrine (down to 20 mcg this AM)  TF held this AM for absent BS and abdominal distention this AM per nsg notes CT abdomen negative yesterday for abcess or acute abdominal issue, liquid stool noted in colon; last BM 5/3. Abdomen has been documented as distended throughout admission but has been soft; no vomiting  Diet Order:   NPO, TF  Skin:  Reviewed, no issues  Last BM:  10/06/15   Labs: BUN 87, Creatinine 2.74, phosphorus 8.3, potassium wdl  Meds: ss novolog, NS at 75 ml/hr,  lactulose, solumedrol, diprivan (669 mL in past 24 hours based on I/O or 182 kcals at current rate of 6.9 ml/hr)  Height:   Ht Readings from Last 1 Encounters:  10/07/15 5\' 10"  (1.778 m)    Weight:   Wt Readings from Last 1 Encounters:  10/08/15 298 lb 11.6 oz (135.5 kg)    Ideal Body Weight:  75.5 kg  BMI:  Body mass index is 42.86 kg/(m^2).  Estimated Nutritional Needs:   Kcal:  1485-1890 kcals  (11-14 kcals/kg using weight of 135.4 kg per ASPEN guidelines for BMI >30)  Protein:  >/=151 g (2.0 g/kg IBW) or 199 g (1.5 g/kg current wt)  Fluid:  >/= 1.5 L  EDUCATION NEEDS:   No education needs identified at this time  Romelle StarcherCate Rue Tinnel MS, RD, LDN [phone] Pager  [phone] Weekend/On-Call Pager

## 2015-10-08 NOTE — Progress Notes (Signed)
[facility] PROGRESS NOTE Date of Admission:  09/25/2015     ID: Jonathon Bryant is a 50 y.o. male with Strep PNA bacteremia, multifocal pna, persistent fevers  Active Problems:   Severe sepsis (HCC)   Acute respiratory failure (HCC)   Pneumonia due to Streptococcus pneumoniae (HCC)   A-fib (HCC)   Acute kidney failure, unspecified (HCC)   Subjective: Clinically worsening, on pressors, difficulty with ventilation. Wbc up to 50. No bms in several day- on lacutlose for cirrhosis.   ROS  Unable to obtain   Medications:  Antibiotics Given (last 72 hours)    Date/Time Action Medication Dose Rate   10/05/15 1806 Given   penicillin G potassium 4 Million Units in dextrose 5 % 250 mL IVPB 4 Million Units 250 mL/hr   10/05/15 1954 Given   penicillin G potassium 4 Million Units in dextrose 5 % 250 mL IVPB 4 Million Units 250 mL/hr   10/06/15 0011 Given   penicillin G potassium 4 Million Units in dextrose 5 % 250 mL IVPB 4 Million Units 250 mL/hr   10/06/15 0409 Given   penicillin G potassium 4 Million Units in dextrose 5 % 250 mL IVPB 4 Million Units 250 mL/hr   10/06/15 0744 Given   penicillin G potassium 4 Million Units in dextrose 5 % 250 mL IVPB 4 Million Units 250 mL/hr   10/06/15 1247 Given   penicillin G potassium 4 Million Units in dextrose 5 % 250 mL IVPB 4 Million Units 250 mL/hr   10/06/15 1532 Given   penicillin G potassium 4 Million Units in dextrose 5 % 250 mL IVPB 4 Million Units 250 mL/hr   10/06/15 1936 Given   penicillin G potassium 4 Million Units in dextrose 5 % 250 mL IVPB 4 Million Units 250 mL/hr   10/06/15 2334 Given   penicillin G potassium 4 Million Units in dextrose 5 % 250 mL IVPB 4 Million Units 250 mL/hr   10/07/15 0350 Given   penicillin G potassium 4 Million Units in dextrose 5 % 250 mL IVPB 4 Million Units 250 mL/hr   10/07/15 0753 Given   penicillin G potassium 4 Million Units in dextrose 5 % 250 mL IVPB 4 Million Units 250 mL/hr    10/07/15 1248 Given   penicillin G potassium 4 Million Units in dextrose 5 % 250 mL IVPB 4 Million Units 250 mL/hr   10/07/15 1410 Given   vancomycin (VANCOCIN) IVPB 1000 mg/200 mL premix 1,000 mg 200 mL/hr   10/07/15 1719 Given   piperacillin-tazobactam (ZOSYN) IVPB 4.5 g 4.5 g 25 mL/hr   10/07/15 2254 Given   piperacillin-tazobactam (ZOSYN) IVPB 4.5 g 4.5 g 25 mL/hr   10/08/15 0514 Given   piperacillin-tazobactam (ZOSYN) IVPB 4.5 g 4.5 g 25 mL/hr   10/08/15 1352 Given   piperacillin-tazobactam (ZOSYN) IVPB 4.5 g 4.5 g 25 mL/hr   10/08/15 1352 Given   vancomycin (VANCOCIN) IVPB 1000 mg/200 mL premix 1,000 mg 200 mL/hr     . antiseptic oral rinse  7 mL Mouth Rinse QID  . artificial tears  1 application Both Eyes U3J  . aspirin  81 mg Per Tube Daily  . budesonide (PULMICORT) nebulizer solution  0.5 mg Nebulization BID  . chlorhexidine gluconate (SAGE KIT)  15 mL Mouth Rinse BID  . folic acid  1 mg Per Tube Daily  . hydrocortisone sodium succinate  50 mg Intravenous Q6H  . insulin aspart  2-6 Units Subcutaneous Q4H  .  ipratropium-albuterol  3 mL Nebulization Q4H  . lactulose  30 g Per Tube TID  . metoCLOPramide  7.5 mg Oral QID  . metronidazole  500 mg Intravenous Q8H  . pantoprazole sodium  40 mg Per Tube Q1200  . piperacillin-tazobactam  4.5 g Intravenous Q8H  . sodium chloride flush  10-40 mL Intracatheter Q12H  . vancomycin  125 mg Oral Q6H  . vancomycin  1,000 mg Intravenous Q24H    Objective: Vital signs in last 24 hours: Temp:  [96.3 F (35.7 C)-99.5 F (37.5 C)] 96.8 F (36 C) (05/05 1500) Pulse Rate:  [58-122] 78 (05/05 1500) Resp:  [0-30] 27 (05/05 1500) BP: (79-159)/(23-73) 91/23 mmHg (05/05 1500) SpO2:  [81 %-96 %] 91 % (05/05 1511) Arterial Line BP: (66-105)/(28-42) 103/42 mmHg (05/05 1500) FiO2 (%):  [60 %-100 %] 80 % (05/05 1511) Weight:  [135.5 kg (298 lb 11.6 oz)] 135.5 kg (298 lb 11.6 oz) (05/05 0448) Constitutional: obese critically ill appearing.  Intubated, OGT in place  HENT: anicteric  Mouth/Throat: Oropharynx ett and OGT inplace Cardiovascular: tachy Pulmonary/Chest: bil rhonchi Abdominal: Soft. Bowel sounds are hypoactive. He exhibits some distension.  Lymphadenopathy: He has no cervical adenopathy.  Neurological: sedated  Skin: Skin is warm and dry. No rash noted except for some telengecatasis on arms .  Psychiatric: intubated Ext trace edema bil GU Foley in place Access R Neck line , R groin A line  Lab Results  Recent Labs  10/07/15 0753  10/08/15 0426 10/08/15 0900 10/08/15 1144  WBC 26.0*  --  54.7*  --   --   HGB 13.1  --  13.0  --   --   HCT 39.0*  --  39.7*  --   --   NA 144  < > 144 142 140  K 3.9  < > 4.4 4.7 5.3*  CL 101  < > 104 102 102  CO2 29  < > 24 24 23   BUN 115*  < > 108* 87* 80*  CREATININE 2.17*  < > 2.72* 2.74* 2.82*  < > = values in this interval not displayed.  Microbiology: Results for orders placed or performed during the hospital encounter of 09/30/2015  Blood culture (routine x 2)     Status: Abnormal   Collection Time: 09/26/2015 11:15 AM  Result Value Ref Range Status   Specimen Description BLOOD RIGHT ANTECUBITAL  Final   Special Requests BOTTLES DRAWN AEROBIC AND ANAEROBIC 1CC  Final   Culture  Setup Time   Final    GRAM POSITIVE COCCI IN BOTH AEROBIC AND ANAEROBIC BOTTLES CRITICAL RESULT CALLED TO, READ BACK BY AND VERIFIED WITH: NATE COOKSON AT 0154 10/02/15.PMH CONFIRMED BY RWW    Culture (A)  Final    STREPTOCOCCUS PNEUMONIAE IN BOTH AEROBIC AND ANAEROBIC BOTTLES    Report Status 10/04/2015 FINAL  Final   Organism ID, Bacteria STREPTOCOCCUS PNEUMONIAE  Final      Susceptibility   Streptococcus pneumoniae - MIC*    PENICILLIN Value in next row       <=0.06SENSITIVE    ERYTHROMYCIN Value in next row Sensitive      <=0.06SENSITIVE    LEVOFLOXACIN Value in next row Sensitive      <=0.06SENSITIVE    VANCOMYCIN Value in next row Sensitive      <=0.06SENSITIVE     CEFTRIAXONE Value in next row Sensitive      <=0.06SENSITIVE    * STREPTOCOCCUS PNEUMONIAE  Blood Culture ID Panel (Reflexed)  Status: Abnormal   Collection Time: 09/26/2015 11:15 AM  Result Value Ref Range Status   Enterococcus species NOT DETECTED NOT DETECTED Final   Vancomycin resistance NOT DETECTED NOT DETECTED Final   Listeria monocytogenes NOT DETECTED NOT DETECTED Final   Staphylococcus species NOT DETECTED NOT DETECTED Final   Staphylococcus aureus NOT DETECTED NOT DETECTED Final   Methicillin resistance NOT DETECTED NOT DETECTED Final   Streptococcus species DETECTED (A) NOT DETECTED Final    Comment: CRITICAL RESULT CALLED TO, READ BACK BY AND VERIFIED WITH: NATE COOKSON AT 0154 10/02/15.PMH    Streptococcus agalactiae NOT DETECTED NOT DETECTED Final   Streptococcus pneumoniae DETECTED (A) NOT DETECTED Final    Comment: CRITICAL RESULT CALLED TO, READ BACK BY AND VERIFIED WITH: NATE COOKSON AT 0154 10/02/15.PMH    Streptococcus pyogenes NOT DETECTED NOT DETECTED Final   Acinetobacter baumannii NOT DETECTED NOT DETECTED Final   Enterobacteriaceae species NOT DETECTED NOT DETECTED Final   Enterobacter cloacae complex NOT DETECTED NOT DETECTED Final   Escherichia coli NOT DETECTED NOT DETECTED Final   Klebsiella oxytoca NOT DETECTED NOT DETECTED Final   Klebsiella pneumoniae NOT DETECTED NOT DETECTED Final   Proteus species NOT DETECTED NOT DETECTED Final   Serratia marcescens NOT DETECTED NOT DETECTED Final   Carbapenem resistance NOT DETECTED NOT DETECTED Final   Haemophilus influenzae NOT DETECTED NOT DETECTED Final   Neisseria meningitidis NOT DETECTED NOT DETECTED Final   Pseudomonas aeruginosa NOT DETECTED NOT DETECTED Final   Candida albicans NOT DETECTED NOT DETECTED Final   Candida glabrata NOT DETECTED NOT DETECTED Final   Candida krusei NOT DETECTED NOT DETECTED Final   Candida parapsilosis NOT DETECTED NOT DETECTED Final   Candida tropicalis NOT DETECTED NOT  DETECTED Final  Blood culture (routine x 2)     Status: Abnormal   Collection Time: 09/27/2015 11:54 AM  Result Value Ref Range Status   Specimen Description BLOOD RIGHT FOREARM  Final   Special Requests BOTTLES DRAWN AEROBIC AND ANAEROBIC 2CC  Final   Culture  Setup Time   Final    GRAM POSITIVE COCCI IN BOTH AEROBIC AND ANAEROBIC BOTTLES CRITICAL RESULT CALLED TO, READ BACK BY AND VERIFIED WITH: NATE COOKSON AT 0154 10/02/15.PMH CONFIRMED BY RWW    Culture (A)  Final    STREPTOCOCCUS PNEUMONIAE IN BOTH AEROBIC AND ANAEROBIC BOTTLES    Report Status 10/05/2015 FINAL  Final   Organism ID, Bacteria STREPTOCOCCUS PNEUMONIAE  Final      Susceptibility   Streptococcus pneumoniae - MIC*    ERYTHROMYCIN <=0.12 SENSITIVE Sensitive     LEVOFLOXACIN 1 SENSITIVE Sensitive     VANCOMYCIN 0.5 SENSITIVE Sensitive     * STREPTOCOCCUS PNEUMONIAE  MRSA PCR Screening     Status: None   Collection Time: 09/20/2015  6:31 PM  Result Value Ref Range Status   MRSA by PCR NEGATIVE NEGATIVE Final    Comment:        The GeneXpert MRSA Assay (FDA approved for NASAL specimens only), is one component of a comprehensive MRSA colonization surveillance program. It is not intended to diagnose MRSA infection nor to guide or monitor treatment for MRSA infections.   Culture, respiratory (tracheal aspirate)     Status: None   Collection Time: 09/26/2015  8:02 PM  Result Value Ref Range Status   Specimen Description TRACHEAL ASPIRATE  Final   Special Requests NONE  Final   Gram Stain MANY WBC SEEN RARE GRAM POSITIVE COCCI   Final  Culture NO GROWTH 3 DAYS  Final   Report Status 10/04/2015 FINAL  Final  CULTURE, BLOOD (ROUTINE X 2) w Reflex to PCR ID Panel     Status: None (Preliminary result)   Collection Time: 10/06/15  3:16 PM  Result Value Ref Range Status   Specimen Description BLOOD LEFT ASSIST CONTROL  Final   Special Requests   Final    BOTTLES DRAWN AEROBIC AND ANAEROBIC  AERO 10CC ANA 5CC    Culture NO GROWTH 2 DAYS  Final   Report Status PENDING  Incomplete  CULTURE, BLOOD (ROUTINE X 2) w Reflex to PCR ID Panel     Status: None (Preliminary result)   Collection Time: 10/06/15  3:37 PM  Result Value Ref Range Status   Specimen Description BLOOD RIGHT ASSIST CONTROL  Final   Special Requests   Final    BOTTLES DRAWN AEROBIC AND ANAEROBIC AERO 10CC ANA Dows   Culture NO GROWTH 2 DAYS  Final   Report Status PENDING  Incomplete  Culture, expectorated sputum-assessment     Status: None   Collection Time: 10/07/15 12:34 PM  Result Value Ref Range Status   Specimen Description TRACHEAL ASPIRATE  Final   Special Requests Normal  Final   Sputum evaluation THIS SPECIMEN IS ACCEPTABLE FOR SPUTUM CULTURE  Final   Report Status 10/07/2015 FINAL  Final  Culture, respiratory (NON-Expectorated)     Status: None (Preliminary result)   Collection Time: 10/07/15 12:34 PM  Result Value Ref Range Status   Specimen Description TRACHEAL ASPIRATE  Final   Special Requests Normal Reflexed from [id number]  Final   Gram Stain PENDING  Incomplete   Culture NO GROWTH < 24 HOURS  Final   Report Status PENDING  Incomplete   \ Studies/Results: Ct Abdomen Pelvis Wo Contrast  10/07/2015  CLINICAL DATA:  Fever, acidosis.  Sepsis. EXAM: CT ABDOMEN AND PELVIS WITHOUT CONTRAST TECHNIQUE: Multidetector CT imaging of the abdomen and pelvis was performed following the standard protocol without IV contrast. COMPARISON:  CT 08/30/2014 FINDINGS: Lower chest: There is bibasilar consolidated airspace disease and small effusions. Hepatobiliary: This nodular contour of the liver. Recanalization of the umbilical veins. Postcholecystectomy. Pancreas: No pancreatic inflammation Spleen: Spleen is minimally enlarged. Adrenal glands are normal. The kidneys, ureters and bladder normal. Adrenals/urinary tract: Adrenal glands and kidneys are normal. Foley catheter within the bladder. Stomach/Bowel: NG tube extends into the stomach. The  duodenum, small-bowel, cecum normal. Appendix not identified. The colon contains fluid stool. No obstructing lesion. No evidence of acute inflammation. Rectum normal. Vascular/Lymphatic: Abdominal aorta is normal caliber with atherosclerotic calcification. There is no retroperitoneal or periportal lymphadenopathy. No pelvic lymphadenopathy. Reproductive: Prostate normal.  Foley catheter within the bladder. Other: No free fluid. Musculoskeletal: No aggressive osseous lesion. IMPRESSION: 1. Bibasilar airspace disease concerning for pneumonia versus aspiration pneumonitis. Small effusions. 2. Nodular liver with mild enlarged spleen consistent with cirrhosis and portal hypertension. 3. Fluid stool in the colon. 4. No abdominal or pelvic abscess. Electronically Signed   By: Suzy Bouchard M.D.   On: 10/07/2015 15:38   Ct Head Wo Contrast  10/07/2015  CLINICAL DATA:  Unresponsive patient. Fever, acidosis. Altered mental status. Severe sepsis. EXAM: CT HEAD WITHOUT CONTRAST TECHNIQUE: Contiguous axial images were obtained from the base of the skull through the vertex without intravenous contrast. COMPARISON:  Brain CT 04/19/2012. FINDINGS: Ventricles and sulci are appropriate for patient's age. No evidence for acute cortically based infarct, intracranial hemorrhage, mass lesion or mass effect. Orbits are  unremarkable. Paranasal sinuses are well aerated. Mastoid air cells are well aerated. Calvarium is intact. IMPRESSION: No acute intracranial process. Electronically Signed   By: Lovey Newcomer M.D.   On: 10/07/2015 15:18   US Renal  10/07/2015  CLINICAL DATA:  Acute renal failure EXAM: RENAL / URINARY TRACT ULTRASOUND COMPLETE COMPARISON:  CT scan 10/07/2015 FINDINGS: Right Kidney: Length: 13.6 cm in length. Echogenicity within normal limits. No mass or hydronephrosis visualized. Left Kidney: Length: 13 cm in length. Limited visualization due to patient's large body habitus. Echogenicity within normal limits. No mass or  hydronephrosis visualized. Bladder: Urinary bladder is not visualized decompressed by Foley catheter. IMPRESSION: No renal mass or hydronephrosis. No nephrolithiasis. The urinary bladder is not visualized decompressed by Foley catheter. Electronically Signed   By: Lahoma Crocker M.D.   On: 10/07/2015 17:04   US Venous Img Lower Bilateral  10/07/2015  CLINICAL DATA:  Fever of unknown origin. History of smoking. Evaluate for DVT. EXAM: BILATERAL LOWER EXTREMITY VENOUS DOPPLER ULTRASOUND TECHNIQUE: Gray-scale sonography with graded compression, as well as color Doppler and duplex ultrasound were performed to evaluate the lower extremity deep venous systems from the level of the common femoral vein and including the common femoral, femoral, profunda femoral, popliteal and calf veins including the posterior tibial, peroneal and gastrocnemius veins when visible. The superficial great saphenous vein was also interrogated. Spectral Doppler was utilized to evaluate flow at rest and with distal augmentation maneuvers in the common femoral, femoral and popliteal veins. COMPARISON:  None. FINDINGS: RIGHT LOWER EXTREMITY Common Femoral Vein: No evidence of thrombus. Normal compressibility, respiratory phasicity and response to augmentation. Saphenofemoral Junction: No evidence of thrombus. Normal compressibility and flow on color Doppler imaging. Profunda Femoral Vein: No evidence of thrombus. Normal compressibility and flow on color Doppler imaging. Femoral Vein: No evidence of thrombus. Normal compressibility, respiratory phasicity and response to augmentation. Popliteal Vein: No evidence of thrombus. Normal compressibility, respiratory phasicity and response to augmentation. Calf Veins: No evidence of thrombus. Normal compressibility and flow on color Doppler imaging. Superficial Great Saphenous Vein: No evidence of thrombus. Normal compressibility and flow on color Doppler imaging. Venous Reflux:  None. Other Findings:  None.  LEFT LOWER EXTREMITY Common Femoral Vein: No evidence of thrombus. Normal compressibility, respiratory phasicity and response to augmentation. Saphenofemoral Junction: No evidence of thrombus. Normal compressibility and flow on color Doppler imaging. Profunda Femoral Vein: No evidence of thrombus. Normal compressibility and flow on color Doppler imaging. Femoral Vein: No evidence of thrombus. Normal compressibility, respiratory phasicity and response to augmentation. Popliteal Vein: No evidence of thrombus. Normal compressibility, respiratory phasicity and response to augmentation. Calf Veins: No evidence of thrombus. Normal compressibility and flow on color Doppler imaging. Superficial Great Saphenous Vein: No evidence of thrombus. Normal compressibility and flow on color Doppler imaging. Venous Reflux:  None. Other Findings:  None. IMPRESSION: No evidence of DVT within either lower extremity. Electronically Signed   By: Sandi Mariscal M.D.   On: 10/07/2015 17:05   Dg Chest Port 1 View  10/07/2015  CLINICAL DATA:  Patient status post central line placement. EXAM: PORTABLE CHEST 1 VIEW COMPARISON:  Chest radiograph 10/07/2015. FINDINGS: Right IJ central venous catheter tip projects over the superior vena cava. ET tube terminates in the mid trachea. Enteric tube courses inferior to the diaphragm. Monitoring leads overlie the patient. New dual-lumen central venous catheter is present with tip projecting over the superior vena cava. Low lung volumes. Stable cardiomegaly. Diffuse bilateral predominately perihilar interstitial opacities and bibasilar  airspace opacities. No pleural effusion or pneumothorax. IMPRESSION: New dual-lumen central venous catheter tip projects over the superior vena cava. Otherwise stable support apparatus. Low lung volumes, cardiomegaly, edema and basilar atelectasis. Electronically Signed   By: Lovey Newcomer M.D.   On: 10/07/2015 16:15   Dg Chest Port 1 View  10/07/2015  CLINICAL DATA:  Hypoxia  EXAM: PORTABLE CHEST 1 VIEW COMPARISON:  Oct 06, 2015 FINDINGS: Endotracheal tube tip is 4.4 cm above the carina. Central catheter tip is in the superior vena cava. Nasogastric tube tip and side port are below the diaphragm. No pneumothorax. There is patchy airspace disease in both lower lobes, slightly increased bilaterally. There is stable mild cardiomegaly. The pulmonary vascular is within normal limits. No adenopathy evident. IMPRESSION: Tube and catheter positions as described without pneumothorax. Stable cardiac silhouette. Increase in airspace consolidation in both lower lobes compared to 1 day prior. Electronically Signed   By: Lowella Grip III M.D.   On: 10/07/2015 12:21    Assessment/Plan: Jonathon Bryant is a 50 y.o. male with Strep PNA bacteremia, R lower lobe infiltrate with persistent fevers and leukocytosis, and an evanescent rash. ECHO neg for veg.   USS 5/3 per radiology did not show enough effusion to tap.  Now with persistent fevers, wbc 50 k,  Worsening renal failure.  CT abd pelvis with no obvious abscess but does have fluid in colon. Repeat bcx neg, sputum cx negative Recommendations Changed to vanco and zosyn for broader coverage I suspect he has C diff- RN to try to disimpact since no BMS Start IV flagyl and oral vanco per ogt Consider rectal Vanco  Thank you very much for the consult. Will follow with you.  [place]acre, DAVID P   10/08/2015, 4:11 PM

## 2015-10-08 NOTE — Progress Notes (Signed)
Comprehensive results called to CCM doctor.

## 2015-10-08 NOTE — Progress Notes (Addendum)
ANTICOAGULATION CONSULT NOTE - Initial Consult  Pharmacy Consult for Heparin  Indication: atrial fibrillation  No Known Allergies  Patient Measurements: Height: _0  (177.8 cm) Weight: 298 lb 11.6 oz (135.5 kg) IBW/kg (Calculated) : 73 Heparin Dosing Weight: 103.8 kg  Vital Signs: Temp: 98.1 F (36.7 C) (05/05 0500) BP: 100/45 mmHg (05/05 0500) Pulse Rate: 75 (05/05 0500)  Labs:  Recent Labs  10/06/15 0510 10/07/15 0753 10/07/15 1725 10/07/15 1937 10/07/15 2356 10/08/15 0426  HGB 13.1 13.1  --   --   --  13.0  HCT 38.4* 39.0*  --   --   --  39.7*  PLT 118* 129*  --   --   --  185  HEPARINUNFRC  --   --   --   --   --  0.74*  CREATININE 2.21* 2.17* 2.52* 2.74* 2.69*  --     Estimated Creatinine Clearance: 44.5 mL/min (by C-G formula based on Cr of 2.69).   Medical History: Past Medical History  Diagnosis Date  . Hypertension   . Cirrhosis ([place])     Medications:  Scheduled:  . antiseptic oral rinse  7 mL Mouth Rinse QID  . aspirin  81 mg Per Tube Daily  . budesonide (PULMICORT) nebulizer solution  0.5 mg Nebulization BID  . chlorhexidine gluconate (SAGE KIT)  15 mL Mouth Rinse BID  . feeding supplement (PRO-STAT SUGAR FREE 64)  30 mL Per Tube TID  . folic acid  1 mg Per Tube Daily  . free water  100 mL Per Tube Q8H  . insulin aspart  2-6 Units Subcutaneous Q4H  . ipratropium-albuterol  3 mL Nebulization Q4H  . lactulose  30 g Per Tube TID  . methylPREDNISolone (SOLU-MEDROL) injection  40 mg Intravenous Q12H  . metoprolol tartrate  25 mg Per Tube BID  . pantoprazole sodium  40 mg Per Tube Q1200  . piperacillin-tazobactam  4.5 g Intravenous Q8H  . sodium chloride flush  10-40 mL Intracatheter Q12H  . vancomycin  1,000 mg Intravenous Q24H   Infusions:  . sodium chloride 75 mL/hr at 10/08/15 0511  . feeding supplement (VITAL HIGH PROTEIN) Stopped (10/07/15 1303)  . heparin 1,550 Units/hr (10/07/15 2329)  . HYDROmorphone 2 mg/hr (10/07/15 2258)  .  midazolam (VERSED) infusion Stopped (10/07/15 1345)  . phenylephrine (NEO-SYNEPHRINE) Adult infusion 34 mcg/min (10/08/15 0520)  . propofol (DIPRIVAN) infusion 8 mcg/kg/min (10/08/15 0520)  . pureflow 3 each (10/07/15 2139)    Assessment: 50 y/o M with sepsis intubated and sedated on Lovenox 1 mg/kg for afib to start CRRT so will need to transition to heparin drip.   Goal of Therapy:  Heparin level 0.3-0.7 units/ml Monitor platelets by anticoagulation protocol: Yes   Plan:  Will not bolus as patient on Lovenox. Will begin heparin infusion 11 hours after last Lovenox dose.  Start heparin infusion at 1550 units/hr Check anti-Xa level in 8 hours and daily while on heparin Continue to monitor H&H and platelets   5/5 AM heparin level 0.74. Decrease to 1450 units/hr and recheck in 8 hours.  Vignesh Willert S 10/08/2015,5:51 AM

## 2015-10-08 NOTE — Progress Notes (Addendum)
Inpatient Diabetes Program Recommendations  AACE/ADA: New Consensus Statement on Inpatient Glycemic Control (2015)  Target Ranges:  Prepandial:   less than 140 mg/dL      Peak postprandial:   less than 180 mg/dL (1-2 hours)      Critically ill patients:  140 - 180 mg/dL   Review of Glycemic Control  Results for Jonathon Bryant, [name] (MRN [id number]) as of 10/08/2015 08:35  Ref. Range 10/07/2015 16:08 10/07/2015 19:41 10/08/2015 00:02 10/08/2015 04:05 10/08/2015 07:54  Glucose-Capillary Latest Ref Range: 65-99 mg/dL 119201 (H) 147210 (H) 829152 (H) 136 (H) 135 (H)    Diabetes history: none noted- last A1C in July 2016 was 4.7% Outpatient Diabetes medications: none Current orders for Inpatient glycemic control: Phase 1 ICU Glycemic control order set- Novolog insulin 2-6 units q4h  * prednisone 40mg  q12h  Inpatient Diabetes Program Recommendations: Agree with current orders- per protocol, RN may transition to IV insulin- {Phase 2 Glycemic Control order set) if there is one CBG > 250 mg/dL or two subsequent CBG > 200 mg/dL a. Search for ICU Glycemic Control Order Set (Phase 2) in Order Management under Order Sets and place orders per protocol, cosign required. b. In Order Management select Active Orders and View by Order Set. Discontinue ALL orders from ICU Glycemic Control Order Set (Phase 1) per protocol, cosign required.  Susette RacerJulie Cadence Minton, RN, BA, MHA, CDE Diabetes Coordinator Inpatient Diabetes Program  [phone] (Team Pager) [phone] [facility] Office) 10/08/2015 8:38 AM

## 2015-10-08 NOTE — Progress Notes (Signed)
Pharmacy Antibiotic Note  Joaquin MusicStephen E Benn is a 50 y.o. male admitted on 08-Feb-2016 with Streptococcus pneumoniae bacteremia. Patient on penicillin G to which organism is sensitive but continues to spike fevers and chest xray shows increased consolidation. Pharmacy has been consulted for vancomycin and Zosyn dosing.  Plan: Vancomycin changed to 1000 IV every 24 hours since pt was stated on CRRT. Continue vanc 1g q 24 hours. Level tomorrow prior to dose.  Continue Zosyn 4.5 g IV q8h (4 hour infusion).  Height: 5\' 10"  (177.8 cm) Weight: 298 lb 11.6 oz (135.5 kg) IBW/kg (Calculated) : 73  Temp (24hrs), Avg:98.1 F (36.7 C), Min:96.3 F (35.7 C), Max:99.9 F (37.7 C)   Recent Labs Lab 03-10-16 1625 03-10-16 1950 03-10-16 2332  10/03/15 0425 10/04/15 0548 10/06/15 0510 10/07/15 0753  10/07/15 1937 10/07/15 2356 10/08/15 0426 10/08/15 0900 10/08/15 1144 10/08/15 1435  WBC  --   --   --   < > 16.3* 16.2* 20.1* 26.0*  --   --   --  54.7*  --   --   --   CREATININE  --   --   --   < > 1.88* 1.82* 2.21* 2.17*  < > 2.74* 2.69* 2.72* 2.74* 2.82*  --   LATICACIDVEN 7.6* 7.3* 5.2*  --   --   --   --   --   --   --   --   --   --  5.5* 7.3*  < > = values in this interval not displayed.  Estimated Creatinine Clearance: 42.5 mL/min (by C-G formula based on Cr of 2.82).    No Known Allergies  Antimicrobials this admission: Levaquin 4/28 >> 5/1 Vancomycin 4/28 >> 4/29 Zosyn 4/28 >> 4/28 Ceftriaxone 4/28 >> 4/28 Pen G 5/1 >> 5/4 Vancomycin 5/4 >> Zosyn 5/4 >>   Dose adjustments this admission:   Microbiology results: 5/4 TA: pending 5/3 BCx: NGTD x 2 4/28 BCx: Strep pneumo x 2 4/28 Sputum (TA): negative  4/28 MRSA PCR: negative  Thank you for allowing pharmacy to be a part of this patient's care.  Olene FlossMelissa D Maccia, Pharm.D Clinical Pharmacist 10/08/2015 3:18 PM

## 2015-10-08 NOTE — Progress Notes (Signed)
Patient continues to be asynchronized with vent with increased work of breathing after vent changes.  Dr. Belia HemanKasa notified and verbal order obtained for vecuronium 20 mg IV to be given.  08:24 Vecuronium given per order. 08:26 patient de-sated to 78-80%. Patient given 100% fi02.  Respiratory and Dr. Belia HemanKasa called to bedside. Vent changes made with improvement noted.  Respiratory to document changes made to vent settings.

## 2015-10-08 NOTE — Progress Notes (Deleted)
ANTICOAGULATION CONSULT NOTE - Initial Consult  Pharmacy Consult for Heparin  Indication: atrial fibrillation  No Known Allergies  Patient Measurements: Height: 5' 10"  (177.8 cm) Weight: 298 lb 11.6 oz (135.5 kg) IBW/kg (Calculated) : 73 Heparin Dosing Weight: 103.8 kg  Vital Signs: Temp: 98.2 F (36.8 C) (05/05 1240) Temp Source: Core (Comment) (05/05 1200) BP: 120/42 mmHg (05/05 1240) Pulse Rate: 75 (05/05 1240)  Labs:  Recent Labs  10/06/15 0510 10/07/15 0753  10/08/15 0426 10/08/15 0900 10/08/15 1144  HGB 13.1 13.1  --  13.0  --   --   HCT 38.4* 39.0*  --  39.7*  --   --   PLT 118* 129*  --  185  --   --   HEPARINUNFRC  --   --   --  0.74*  --  0.69  CREATININE 2.21* 2.17*  < > 2.72* 2.74* 2.82*  < > = values in this interval not displayed.  Estimated Creatinine Clearance: 42.5 mL/min (by C-G formula based on Cr of 2.82).   Medical History: Past Medical History  Diagnosis Date  . Hypertension   . Cirrhosis ([place])     Medications:  Scheduled:  . sodium chloride   Intravenous Once  . antiseptic oral rinse  7 mL Mouth Rinse QID  . artificial tears  1 application Both Eyes E7M  . aspirin  81 mg Per Tube Daily  . budesonide (PULMICORT) nebulizer solution  0.5 mg Nebulization BID  . chlorhexidine gluconate (SAGE KIT)  15 mL Mouth Rinse BID  . feeding supplement (PRO-STAT SUGAR FREE 64)  30 mL Per Tube TID  . folic acid  1 mg Per Tube Daily  . free water  100 mL Per Tube Q8H  . insulin aspart  2-6 Units Subcutaneous Q4H  . ipratropium-albuterol  3 mL Nebulization Q4H  . lactulose  30 g Per Tube TID  . methylPREDNISolone (SOLU-MEDROL) injection  20 mg Intravenous Daily  . metoCLOPramide  7.5 mg Oral QID  . pantoprazole sodium  40 mg Per Tube Q1200  . piperacillin-tazobactam  4.5 g Intravenous Q8H  . sodium bicarbonate      . sodium bicarbonate  50 mEq Intravenous STAT  . sodium bicarbonate  50 mEq Intravenous STAT  . sodium chloride flush  10-40 mL  Intracatheter Q12H  . vancomycin  1,000 mg Intravenous Q24H   Infusions:  . feeding supplement (VITAL HIGH PROTEIN) Stopped (10/08/15 0800)  . heparin 1,550 Units/hr (10/07/15 2329)  . HYDROmorphone 2 mg/hr (10/08/15 0929)  . phenylephrine (NEO-SYNEPHRINE) Adult infusion 40 mcg/min (10/08/15 1212)  . propofol (DIPRIVAN) infusion 8 mcg/kg/min (10/08/15 0929)  . pureflow 3 each (10/08/15 1241)  .  sodium bicarbonate 150 mEq in sterile water 1000 mL infusion    . vasopressin (PITRESSIN) infusion - *FOR SHOCK* 0.03 Units/min (10/08/15 1245)    Assessment: 50 y/o M with sepsis intubated and sedated on Lovenox 1 mg/kg for afib to start CRRT so will need to transition to heparin drip.   Goal of Therapy:  Heparin level 0.3-0.7 units/ml Monitor platelets by anticoagulation protocol: Yes   Plan:  Will not bolus as patient on Lovenox. Will begin heparin infusion 11 hours after last Lovenox dose.  Start heparin infusion at 1550 units/hr Check anti-Xa level in 8 hours and daily while on heparin Continue to monitor H&H and platelets   5/5 AM heparin level 0.74. Decrease to 1450 units/hr and recheck in 8 hours. 5/5 @ 1144 HL 0.69 Continue current rate and  recheck in 6 hours  Vincenzina Jagoda D Faydra Korman, Pharm.D Clinical Pharmacist   10/08/2015,1:02 PM

## 2015-10-08 NOTE — Progress Notes (Signed)
eLink Physician-Brief Progress Note Patient Name: Joaquin MusicStephen E Florer DOB: 11/13/1965 MRN: [id number]   Date of Service  10/08/2015  HPI/Events of Note  ALT, AST and Bilirubin all increased.   eICU Interventions  Will D/C Tylenol PRN.      Intervention Category Intermediate Interventions: Diagnostic test evaluation  Sommer,Steven Eugene 10/08/2015, 5:03 PM

## 2015-10-08 NOTE — Progress Notes (Signed)
Patient without audible bowel sounds, distended abdomen, and no BM since 10/06/2015. Information discussed with Dr. Belia HemanKasa, tube feeds placed on hold until rounds to discuss.

## 2015-10-08 NOTE — Progress Notes (Signed)
E-link notified of hypotension post vecuronium medication. No new orders obtained at this time.

## 2015-10-08 NOTE — Progress Notes (Signed)
Dr. Belia HemanKasa at bedside discussing with wife need and benefit for arterial line. Wife has agreed to procedure.  Consent signed.

## 2015-10-08 NOTE — Progress Notes (Signed)
Dr. Belia HemanKasa at bedside assessing patient.  Patient OG tube hooked to suction per MD.  Vasopressin started at shock dose.

## 2015-10-08 NOTE — Procedures (Signed)
Arterial Line Placement: Indication: Frequent blood draws; Invasive BP monitoring.   Consent: Emergent.   Risks and benefits explained to patient and/or family in detail including risk of infection, bleeding, respiratory failure and death..   Hand washing performed prior to starting the procedure.   Procedure: An active timeout was performed and correct patient, name, & ID confirmed. Physicial exam was performed to ensure adequate perfusion.  Using sterile technique, an aterial line was inserted into the RT Femoral artery.  Catheter threaded and the needle was removed with appropriate blood return.  Arterial waveform was noted.  After the procedure, the patient's extremities were observed to be pink and warm. BIOPATCH APPLIED  Estimated Blood Loss: None .   Number of Attempts: 1.   Complications: None .  Operator: Trishelle Devora.   Lucie LeatherKurian David Nigel Wessman, M.D.  [facility]  Medical Director [facility] Medical Director [facility]

## 2015-10-08 NOTE — Progress Notes (Signed)
ELink notified of CBC results.  No new orders obtained.

## 2015-10-08 NOTE — Progress Notes (Signed)
Chaplain rounded the unit and provided a compassionate presence with silent prayer. Chaplain Jovonda Selner [phone] 

## 2015-10-08 NOTE — Progress Notes (Signed)
Patient asynchronized with vent.  Amy with respiratory at bedside making changes to vent settings per Dr. Belia HemanKasa.

## 2015-10-08 NOTE — Progress Notes (Addendum)
ANTICOAGULATION CONSULT NOTE - Initial Consult  Pharmacy Consult for Heparin  Indication: atrial fibrillation  No Known Allergies  Patient Measurements: Height: _0  (177.8 cm) Weight: 298 lb 11.6 oz (135.5 kg) IBW/kg (Calculated) : 73 Heparin Dosing Weight: 103.8 kg   Vital Signs: Temp: 97.2 F (36.2 C) (05/05 2100) Temp Source: Core (Comment) (05/05 1700) BP: 63/25 mmHg (05/05 2100) Pulse Rate: 77 (05/05 2100)  Labs:  Recent Labs  10/07/15 0753  10/08/15 0426  10/08/15 1144 10/08/15 1556 10/08/15 2001 10/08/15 2019  HGB 13.1  --  13.0  --   --  13.1  --   --   HCT 39.0*  --  39.7*  --   --  42.3  --   --   PLT 129*  --  185  --   --  198  --   --   HEPARINUNFRC  --   --  0.74*  --  0.69  --   --  0.43  CREATININE 2.17*  < > 2.72*  < > 2.82* 2.63* 2.70*  --   < > = values in this interval not displayed.  Estimated Creatinine Clearance: 44.4 mL/min (by C-G formula based on Cr of 2.7).   Medical History: Past Medical History  Diagnosis Date  . Hypertension   . Cirrhosis ([place])     Medications:  Scheduled:  . antiseptic oral rinse  7 mL Mouth Rinse QID  . artificial tears  1 application Both Eyes K8T  . aspirin  81 mg Per Tube Daily  . budesonide (PULMICORT) nebulizer solution  0.5 mg Nebulization BID  . chlorhexidine gluconate (SAGE KIT)  15 mL Mouth Rinse BID  . folic acid  1 mg Per Tube Daily  . hydrocortisone sodium succinate  50 mg Intravenous Q6H  . insulin aspart  2-6 Units Subcutaneous Q4H  . ipratropium-albuterol  3 mL Nebulization Q4H  . lactulose  30 g Per Tube TID  . metoCLOPramide  7.5 mg Oral QID  . metronidazole  500 mg Intravenous Q8H  . pantoprazole sodium  40 mg Per Tube Q1200  . piperacillin-tazobactam  4.5 g Intravenous Q8H  . sodium chloride flush  10-40 mL Intracatheter Q12H  . vancomycin  125 mg Oral Q6H  . vancomycin  1,000 mg Intravenous Q24H    Assessment: 5/5 :  HL @ 11:44 = 0.69           HL @ 20:00 = 0.43   Goal of  Therapy:  Heparin level 0.3-0.7 units/ml Monitor platelets by anticoagulation protocol: Yes   Plan:  Continue this pt on current rate of 1450 units/hr and recheck HL on 5/6 with AM labs.   [name] D 10/08/2015,9:27 PM

## 2015-10-08 NOTE — Progress Notes (Signed)
PULMONARY / CRITICAL CARE MEDICINE   Name: Jonathon Bryant MRN: [id number] DOB: [birth year]    ADMISSION DATE:  10/29/2015   PT PROFILE:   71 M with Htn, NASH admitted with fever, acidosis, AMS, severe sepsis,  RLL PNA. Intubated in ED. Developed AFRVR  Subjective now with afib with  RVR, fio2 at 60%, PEEp at 10 Increased WOB on PC mode, remains obtunded vasc cath placed started on CRRT last night  MAJOR EVENTS/TEST RESULTS: 04/28 Admitted with diagnoses of severe sepsis, acute respiratory failure, suspected PNA, h/o NASH. Developed AFRVR in ED and suffered NSVT while CVL being placed 04/28 Abdominal US: No ascites seen 04/28 TTE: LVEF 50-55%. LA mildly dilated. Normal PA pressures 04/29 BC positive for pneumococcus 04/29 refractory hypoxemia - vent changed to PC mode 04/30 improved oxygenation. AF persists. Full dose enoxaparin initiated 5/2 afib with rvr-amiodarone started 5/3 ct hest b/l infiltrates effusion,  5/4 started CRRT  INDWELLING DEVICES:: ETT 04/28 >>  R IJ CVL 04/28 >>   MICRO DATA: MRSA PCR 04/28 >> NEG Urine 04/28 >> UA negative Resp  04/28 >>  Blood 04/28 >> 2/2 pneumococcus  PCT 04/28 - 04/30: 19.05, 23.35, 17.25  ANTIMICROBIALS:  Vanc 04/28 >> 04/29 Levofloxacin 04/28 >>  Restarted vancomycin/zosyn 5/4>>>>   VITAL SIGNS: BP 107/55 mmHg  Pulse 78  Temp(Src) 98.6 F (37 C) (Core (Comment))  Resp 11  Ht 5\' 10"  (1.778 m)  Wt 298 lb 11.6 oz (135.5 kg)  BMI 42.86 kg/m2  SpO2 89%  HEMODYNAMICS: CVP:  [8 mmHg-16 mmHg] 16 mmHg  VENTILATOR SETTINGS: Vent Mode:  [-] PCV FiO2 (%):  [40 %-100 %] 60 % Set Rate:  [16 bmp-26 bmp] 26 bmp Vt Set:  [16 mL-500 mL] 500 mL PEEP:  [5 cmH20-10 cmH20] 10 cmH20  INTAKE / OUTPUT: I/O last 3 completed shifts: In: 6945.1 [I.V.:3582.1; NG/GT:2063; IV Piggyback:1300] Out: 4601 [Urine:3452; Other:1149]  PHYSICAL EXAMINATION: General: Obese, RASS -4, intubated and sedated/NMB +resp distress Neuro: Limited exam  due to NMB HEENT: PERRLA, mild scleral edema, ETT, oral mucosa pink Cardiovascular: IRIR in the low 100s, no MRG  Lungs: basilar crackles and rhonchi, mild expiratory wheezes in right upper lung fields Abdomen: obese, soft, ND, diminished BS Ext: warm, no edema Skin: limited exam, no lesions noted  LABS:  BMET  Recent Labs Lab 10/07/15 1937 10/07/15 2356 10/08/15 0426  NA 145 145 144  K 5.1 4.8 4.4  CL 104 105 104  CO2 28 27 24   BUN 123* 96* 108*  CREATININE 2.74* 2.69* 2.72*  GLUCOSE 227* 176* 144*    Electrolytes  Recent Labs Lab 10/07/15 0753  10/07/15 1937 10/07/15 2356 10/08/15 0426  CALCIUM 8.4*  < > 7.8* 7.9* 8.1*  MG 2.7*  --   --   --   --   PHOS 4.2  < > 8.0* 8.2* 8.1*  < > = values in this interval not displayed.  CBC  Recent Labs Lab 10/06/15 0510 10/07/15 0753 10/08/15 0426  WBC 20.1* 26.0* 54.7*  HGB 13.1 13.1 13.0  HCT 38.4* 39.0* 39.7*  PLT 118* 129* 185    Coag's  Recent Labs Lab 10/29/15 1115  APTT 29  INR 1.68    Sepsis Markers  Recent Labs Lab 10-29-15 1625 10-29-2015 1950 Oct 29, 2015 2332 10/02/15 0505 10/03/15 0425 10/04/15 0548  LATICACIDVEN 7.6* 7.3* 5.2*  --   --   --   PROCALCITON  --   --   --  23.35 17.25 12.03  ABG  Recent Labs Lab 10/02/15 0646 10/07/15 1240 10/07/15 1630  PHART 7.20* 7.24* 7.24*  PCO2ART 77* 83* 82*  PO2ART 61* 62* 120*    Liver Enzymes  Recent Labs Lab 10/02/15 0505 10/03/15 0425 10/04/15 0548  10/07/15 1937 10/07/15 2356 10/08/15 0426  AST 65* 113* 133*  --   --   --   --   ALT 38 44 47  --   --   --   --   ALKPHOS 39 61 80  --   --   --   --   BILITOT 2.7* 2.5* 2.9*  --   --   --   --   ALBUMIN 2.6* 2.5* 2.3*  < > 1.8* 2.0* 2.1*  < > = values in this interval not displayed.  Cardiac Enzymes  Recent Labs Lab 09/20/2015 1115 10/02/15 0505 10/03/15 0425  TROPONINI 0.05* 0.14* 0.12*    Glucose  Recent Labs Lab 10/06/15 2325 10/07/15 0727 10/07/15 1608  10/07/15 1941 10/08/15 0002 10/08/15 0405  GLUCAP 180* 204* 201* 210* 152* 136*    Ct Abdomen Pelvis Wo Contrast  10/07/2015  CLINICAL DATA:  Fever, acidosis.  Sepsis. EXAM: CT ABDOMEN AND PELVIS WITHOUT CONTRAST TECHNIQUE: Multidetector CT imaging of the abdomen and pelvis was performed following the standard protocol without IV contrast. COMPARISON:  CT 08/30/2014 FINDINGS: Lower chest: There is bibasilar consolidated airspace disease and small effusions. Hepatobiliary: This nodular contour of the liver. Recanalization of the umbilical veins. Postcholecystectomy. Pancreas: No pancreatic inflammation Spleen: Spleen is minimally enlarged. Adrenal glands are normal. The kidneys, ureters and bladder normal. Adrenals/urinary tract: Adrenal glands and kidneys are normal. Foley catheter within the bladder. Stomach/Bowel: NG tube extends into the stomach. The duodenum, small-bowel, cecum normal. Appendix not identified. The colon contains fluid stool. No obstructing lesion. No evidence of acute inflammation. Rectum normal. Vascular/Lymphatic: Abdominal aorta is normal caliber with atherosclerotic calcification. There is no retroperitoneal or periportal lymphadenopathy. No pelvic lymphadenopathy. Reproductive: Prostate normal.  Foley catheter within the bladder. Other: No free fluid. Musculoskeletal: No aggressive osseous lesion. IMPRESSION: 1. Bibasilar airspace disease concerning for pneumonia versus aspiration pneumonitis. Small effusions. 2. Nodular liver with mild enlarged spleen consistent with cirrhosis and portal hypertension. 3. Fluid stool in the colon. 4. No abdominal or pelvic abscess. Electronically Signed   By: Genevive BiStewart  Edmunds M.D.   On: 10/07/2015 15:38   Ct Head Wo Contrast  10/07/2015  CLINICAL DATA:  Unresponsive patient. Fever, acidosis. Altered mental status. Severe sepsis. EXAM: CT HEAD WITHOUT CONTRAST TECHNIQUE: Contiguous axial images were obtained from the base of the skull through the  vertex without intravenous contrast. COMPARISON:  Brain CT 04/19/2012. FINDINGS: Ventricles and sulci are appropriate for patient's age. No evidence for acute cortically based infarct, intracranial hemorrhage, mass lesion or mass effect. Orbits are unremarkable. Paranasal sinuses are well aerated. Mastoid air cells are well aerated. Calvarium is intact. IMPRESSION: No acute intracranial process. Electronically Signed   By: Annia Beltrew  Davis M.D.   On: 10/07/2015 15:18   [place] Renal  10/07/2015  CLINICAL DATA:  Acute renal failure EXAM: RENAL / URINARY TRACT ULTRASOUND COMPLETE COMPARISON:  CT scan 10/07/2015 FINDINGS: Right Kidney: Length: 13.6 cm in length. Echogenicity within normal limits. No mass or hydronephrosis visualized. Left Kidney: Length: 13 cm in length. Limited visualization due to patient's large body habitus. Echogenicity within normal limits. No mass or hydronephrosis visualized. Bladder: Urinary bladder is not visualized decompressed by Foley catheter. IMPRESSION: No renal mass or hydronephrosis. No nephrolithiasis.  The urinary bladder is not visualized decompressed by Foley catheter. Electronically Signed   By: Natasha Mead M.D.   On: 10/07/2015 17:04   US Venous Img Lower Bilateral  10/07/2015  CLINICAL DATA:  Fever of unknown origin. History of smoking. Evaluate for DVT. EXAM: BILATERAL LOWER EXTREMITY VENOUS DOPPLER ULTRASOUND TECHNIQUE: Gray-scale sonography with graded compression, as well as color Doppler and duplex ultrasound were performed to evaluate the lower extremity deep venous systems from the level of the common femoral vein and including the common femoral, femoral, profunda femoral, popliteal and calf veins including the posterior tibial, peroneal and gastrocnemius veins when visible. The superficial great saphenous vein was also interrogated. Spectral Doppler was utilized to evaluate flow at rest and with distal augmentation maneuvers in the common femoral, femoral and popliteal veins.  COMPARISON:  None. FINDINGS: RIGHT LOWER EXTREMITY Common Femoral Vein: No evidence of thrombus. Normal compressibility, respiratory phasicity and response to augmentation. Saphenofemoral Junction: No evidence of thrombus. Normal compressibility and flow on color Doppler imaging. Profunda Femoral Vein: No evidence of thrombus. Normal compressibility and flow on color Doppler imaging. Femoral Vein: No evidence of thrombus. Normal compressibility, respiratory phasicity and response to augmentation. Popliteal Vein: No evidence of thrombus. Normal compressibility, respiratory phasicity and response to augmentation. Calf Veins: No evidence of thrombus. Normal compressibility and flow on color Doppler imaging. Superficial Great Saphenous Vein: No evidence of thrombus. Normal compressibility and flow on color Doppler imaging. Venous Reflux:  None. Other Findings:  None. LEFT LOWER EXTREMITY Common Femoral Vein: No evidence of thrombus. Normal compressibility, respiratory phasicity and response to augmentation. Saphenofemoral Junction: No evidence of thrombus. Normal compressibility and flow on color Doppler imaging. Profunda Femoral Vein: No evidence of thrombus. Normal compressibility and flow on color Doppler imaging. Femoral Vein: No evidence of thrombus. Normal compressibility, respiratory phasicity and response to augmentation. Popliteal Vein: No evidence of thrombus. Normal compressibility, respiratory phasicity and response to augmentation. Calf Veins: No evidence of thrombus. Normal compressibility and flow on color Doppler imaging. Superficial Great Saphenous Vein: No evidence of thrombus. Normal compressibility and flow on color Doppler imaging. Venous Reflux:  None. Other Findings:  None. IMPRESSION: No evidence of DVT within either lower extremity. Electronically Signed   By: Simonne Come M.D.   On: 10/07/2015 17:05   Dg Chest Port 1 View  10/07/2015  CLINICAL DATA:  Patient status post central line placement.  EXAM: PORTABLE CHEST 1 VIEW COMPARISON:  Chest radiograph 10/07/2015. FINDINGS: Right IJ central venous catheter tip projects over the superior vena cava. ET tube terminates in the mid trachea. Enteric tube courses inferior to the diaphragm. Monitoring leads overlie the patient. New dual-lumen central venous catheter is present with tip projecting over the superior vena cava. Low lung volumes. Stable cardiomegaly. Diffuse bilateral predominately perihilar interstitial opacities and bibasilar airspace opacities. No pleural effusion or pneumothorax. IMPRESSION: New dual-lumen central venous catheter tip projects over the superior vena cava. Otherwise stable support apparatus. Low lung volumes, cardiomegaly, edema and basilar atelectasis. Electronically Signed   By: Annia Belt M.D.   On: 10/07/2015 16:15   Dg Chest Port 1 View  10/07/2015  CLINICAL DATA:  Hypoxia EXAM: PORTABLE CHEST 1 VIEW COMPARISON:  Oct 06, 2015 FINDINGS: Endotracheal tube tip is 4.4 cm above the carina. Central catheter tip is in the superior vena cava. Nasogastric tube tip and side port are below the diaphragm. No pneumothorax. There is patchy airspace disease in both lower lobes, slightly increased bilaterally. There is stable mild cardiomegaly.  The pulmonary vascular is within normal limits. No adenopathy evident. IMPRESSION: Tube and catheter positions as described without pneumothorax. Stable cardiac silhouette. Increase in airspace consolidation in both lower lobes compared to 1 day prior. Electronically Signed   By: Bretta Bang III M.D.   On: 10/07/2015 12:21     Discussion 50 yo male with a PMH  HTN, NASH admitted with fever, acidosis, AMS, severe sepsis secondary to strep pneumonia; now intubated and sedated;   ASSESSMENT / PLAN:  PULMONARY A:  Acute hypoxic respiratory failure Severe hypoxemia-still requiring >50% FiO2 RLL CAP - pneumococcal Smoker without h/o COPD findings suggest COPD  para-pneumonic effusion P:    Cont full vent support with PC mode - settings reviewed and/or adjusted Cont vent bundle Daily SBT if/when meets criteria Daily CXR ABG pending  CARDIOVASCULAR A:  New onset AFRVR - controlled with metoprolol Transient septic shock, resolved H/O hypertension P:  Monitor rhythm and BP on amiodarone  Continue heparin infusion Will obtain TEE to assess for abscess   RENAL A:   AKI, nonoliguric Lactic acidosis - mild, due to severe sepsis P:   Monitor BMET intermittently Monitor I/Os Correct electrolytes as indicated Avoid nephrotoxic medications  GASTROINTESTINAL A:   Obesity NASH P:   SUP: enteral PPI TF protocol initiated 04/28 Monitor LFTs intermittently  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 P:  DVT px: full dose LMWH for AF Monitor CBC intermittently Transfuse per usual guidelines Monitor plt count closely while on LMWH  INFECTIOUS A:   Severe sepsis-persistent fever RLL pneumococcal PNA Possible R para-pneumonic effusion Minimal fluid seen on [place] of chest 04/30 Still with fevers, now with rash, ID consulted P:   Monitor temp, WBC count increasing Micro and abx as above ID consulted  ENDOCRINE A:   No issues   P:   Monitor CBGs Consider SSI if glu > 180  NEUROLOGIC A:   Acute metabolic encephalopathy  Hyperammonemia  P:   RASS goal: -1, -2 PAD protocol 04/28 - propofol-4/28, PRN fentanyl, PRN midaz and fentanyl; gtt 04/28 Cont Lactulose and monitor ammonia level      I have personally reviewed/obtained a history, examined the patient, evaluated Pertinent laboratory and RadioGraph[employer]/imaging results, and  formulated the assessment and plan   The Patient requires high complexity decision making for assessment and support, frequent evaluation and titration of therapies, application of advanced monitoring technologies and extensive interpretation of multiple databases.  Critical Care Time devoted to  patient care services described in this note is 40 minutes.    Overall, patient is critically ill, prognosis is guarded.  Patient with Multiorgan failure and at high risk for cardiac arrest and death.    Lucie Leather, M.D.  [facility]  Medical Director [facility] Medical Director [facility] Cardio-Pulmonary Department

## 2015-10-08 NOTE — Progress Notes (Signed)
Plan of care since decompensation discussed with Dr. Belia HemanKasa.  Verbal order obtained to discontinue tube feeds obtained.

## 2015-10-08 NOTE — Progress Notes (Signed)
eLink Physician-Brief Progress Note Patient Name: Jonathon MusicStephen E Eland DOB: 04/19/1966 MRN: [id number]   Date of Service  10/08/2015  HPI/Events of Note  Hypotension - BP = 74/20. Already on Phenylephrine and Vasopressin IV infusion. Last pH = 7.17. Already on a NaHCO3 IV infusion. CVP = 18.   eICU Interventions  Will order: 1. NaHCO3 100 meq IV now.  2. Recheck ABG at 8 PM. 3. CBC now.      Intervention Category Major Interventions: Hypotension - evaluation and management  Sommer,Jonathon Bryant 10/08/2015, 5:47 PM

## 2015-10-09 DIAGNOSIS — E872 Acidosis: Secondary | ICD-10-CM

## 2015-10-09 DIAGNOSIS — K559 Vascular disorder of intestine, unspecified: Secondary | ICD-10-CM

## 2015-10-09 LAB — CBC WITH DIFFERENTIAL/PLATELET
Basophils Absolute: 0.7 10*3/uL — ABNORMAL HIGH (ref 0–0.1)
Basophils Relative: 1 %
Eosinophils Absolute: 0 10*3/uL (ref 0–0.7)
Eosinophils Relative: 0 %
HCT: 41.5 % (ref 40.0–52.0)
Hemoglobin: 12.9 g/dL — ABNORMAL LOW (ref 13.0–18.0)
Lymphocytes Relative: 2 %
Lymphs Abs: 1.4 10*3/uL (ref 1.0–3.6)
MCH: 33.9 pg (ref 26.0–34.0)
MCHC: 31 g/dL — ABNORMAL LOW (ref 32.0–36.0)
MCV: 109.5 fL — ABNORMAL HIGH (ref 80.0–100.0)
Monocytes Absolute: 2.1 10*3/uL — ABNORMAL HIGH (ref 0.2–1.0)
Monocytes Relative: 3 %
Neutro Abs: 64.2 10*3/uL — ABNORMAL HIGH (ref 1.4–6.5)
Neutrophils Relative %: 94 %
Platelets: 196 10*3/uL (ref 150–440)
RBC: 3.79 MIL/uL — ABNORMAL LOW (ref 4.40–5.90)
RDW: 17 % — ABNORMAL HIGH (ref 11.5–14.5)
WBC: 68.4 10*3/uL (ref 3.8–10.6)

## 2015-10-09 LAB — HEPARIN LEVEL (UNFRACTIONATED): Heparin Unfractionated: 0.24 IU/mL — ABNORMAL LOW (ref 0.30–0.70)

## 2015-10-09 LAB — RENAL FUNCTION PANEL
ALBUMIN: 1.9 g/dL — AB (ref 3.5–5.0)
ANION GAP: 15 (ref 5–15)
ANION GAP: 13 (ref 5–15)
Albumin: 1.9 g/dL — ABNORMAL LOW (ref 3.5–5.0)
BUN: 45 mg/dL — ABNORMAL HIGH (ref 6–20)
BUN: 52 mg/dL — ABNORMAL HIGH (ref 6–20)
CALCIUM: 6.9 mg/dL — AB (ref 8.9–10.3)
CHLORIDE: 101 mmol/L (ref 101–111)
CO2: 22 mmol/L (ref 22–32)
CO2: 23 mmol/L (ref 22–32)
Calcium: 7.1 mg/dL — ABNORMAL LOW (ref 8.9–10.3)
Chloride: 101 mmol/L (ref 101–111)
Creatinine, Ser: 2.43 mg/dL — ABNORMAL HIGH (ref 0.61–1.24)
Creatinine, Ser: 2.45 mg/dL — ABNORMAL HIGH (ref 0.61–1.24)
GFR, EST AFRICAN AMERICAN: 34 mL/min — AB (ref >60)
GFR, EST AFRICAN AMERICAN: 33 mL/min — AB (ref >60)
GFR, EST NON AFRICAN AMERICAN: 29 mL/min — AB (ref >60)
GFR, EST NON AFRICAN AMERICAN: 29 mL/min — AB (ref >60)
GLUCOSE: 54 mg/dL — AB (ref 65–99)
Glucose, Bld: 79 mg/dL (ref 65–99)
PHOSPHORUS: 9.6 mg/dL — AB (ref 2.5–4.6)
POTASSIUM: 5.5 mmol/L — AB (ref 3.5–5.1)
POTASSIUM: 6.1 mmol/L — AB (ref 3.5–5.1)
Phosphorus: 8.8 mg/dL — ABNORMAL HIGH (ref 2.5–4.6)
SODIUM: 137 mmol/L (ref 135–145)
Sodium: 138 mmol/L (ref 135–145)

## 2015-10-09 LAB — GLUCOSE, CAPILLARY
GLUCOSE-CAPILLARY: 45 mg/dL — AB (ref 65–99)
GLUCOSE-CAPILLARY: 88 mg/dL (ref 65–99)
GLUCOSE-CAPILLARY: 75 mg/dL (ref 65–99)
Glucose-Capillary: 77 mg/dL (ref 65–99)
Glucose-Capillary: 34 mg/dL — CL (ref 65–99)
Glucose-Capillary: 71 mg/dL (ref 65–99)

## 2015-10-09 MED ORDER — METOCLOPRAMIDE HCL 5 MG/ML IJ SOLN
7.5000 mg | Freq: Four times a day (QID) | INTRAMUSCULAR | Status: DC
  Administered 2015-10-09: 7.5 mg via INTRAVENOUS
  Filled 2015-10-09: qty 2

## 2015-10-09 MED ORDER — STERILE WATER FOR INJECTION IJ SOLN
INTRAMUSCULAR | Status: AC
  Administered 2015-10-09: 10 mL
  Filled 2015-10-09: qty 10

## 2015-10-09 MED ORDER — EPINEPHRINE HCL 1 MG/ML IJ SOLN
0.5000 ug/min | INTRAVENOUS | Status: DC
  Administered 2015-10-09: 0.5 ug/min via INTRAVENOUS
  Filled 2015-10-09: qty 4

## 2015-10-09 MED ORDER — DEXTROSE 50 % IV SOLN
INTRAVENOUS | Status: AC
  Administered 2015-10-09: 08:00:00
  Filled 2015-10-09: qty 100

## 2015-10-09 MED ORDER — PANTOPRAZOLE SODIUM 40 MG IV SOLR
40.0000 mg | Freq: Every day | INTRAVENOUS | Status: DC
  Filled 2015-10-09: qty 40

## 2015-10-09 MED ORDER — DEXTROSE 50 % IV SOLN
INTRAVENOUS | Status: AC
  Filled 2015-10-09: qty 50

## 2015-10-09 MED ORDER — DEXTROSE 50 % IV SOLN
25.0000 g | Freq: Once | INTRAVENOUS | Status: AC
  Administered 2015-10-09: 25 g via INTRAVENOUS

## 2015-10-09 MED ORDER — ATROPINE SULFATE 1 MG/10ML IJ SOSY
PREFILLED_SYRINGE | INTRAMUSCULAR | Status: AC
  Administered 2015-10-09: 09:00:00
  Filled 2015-10-09: qty 10

## 2015-10-09 MED ORDER — EPINEPHRINE HCL 0.1 MG/ML IJ SOSY
PREFILLED_SYRINGE | INTRAMUSCULAR | Status: AC
  Administered 2015-10-09: 09:00:00
  Filled 2015-10-09: qty 10

## 2015-10-09 MED ORDER — HEPARIN BOLUS VIA INFUSION
1500.0000 [IU] | Freq: Once | INTRAVENOUS | Status: AC
  Administered 2015-10-09: 1500 [IU] via INTRAVENOUS
  Filled 2015-10-09: qty 1500

## 2015-10-09 MED ORDER — DEXTROSE 5 % IV SOLN
INTRAVENOUS | Status: DC
Start: 2015-10-09 — End: 2015-10-09
  Administered 2015-10-09: 05:00:00 via INTRAVENOUS

## 2015-10-09 MED ORDER — PIPERACILLIN-TAZOBACTAM 3.375 G IVPB
3.3750 g | Freq: Three times a day (TID) | INTRAVENOUS | Status: DC
  Filled 2015-10-09 (×2): qty 50

## 2015-10-09 MED ORDER — DEXTROSE 10 % IV SOLN
INTRAVENOUS | Status: DC
  Administered 2015-10-09: 08:00:00 via INTRAVENOUS

## 2015-10-09 MED ORDER — PUREFLOW DIALYSIS SOLUTION
INTRAVENOUS | Status: DC
  Administered 2015-10-09: 05:00:00 via INTRAVENOUS_CENTRAL

## 2015-10-09 MED ORDER — DEXTROSE 50 % IV SOLN
50.0000 g | Freq: Once | INTRAVENOUS | Status: AC
  Administered 2015-10-09: 50 g via INTRAVENOUS

## 2015-10-10 LAB — CULTURE, RESPIRATORY W GRAM STAIN: Culture: NORMAL

## 2015-10-10 LAB — CULTURE, RESPIRATORY: SPECIAL REQUESTS: NORMAL

## 2015-10-11 LAB — CULTURE, BLOOD (ROUTINE X 2)
CULTURE: NO GROWTH
Culture: NO GROWTH

## 2015-10-13 LAB — BLOOD GAS, ARTERIAL
ALLENS TEST (PASS/FAIL): POSITIVE — AB
Acid-base deficit: 10.5 mmol/L — ABNORMAL HIGH (ref 0.0–2.0)
BICARBONATE: 18.7 meq/L — AB (ref 21.0–28.0)
FIO2: 0.6
LHR: 30 {breaths}/min
MECHVT: 600 mL
O2 Saturation: 59.9 %
PCO2 ART: 55 mmHg — AB (ref 32.0–48.0)
PEEP: 10 cmH2O
PO2 ART: 42 mmHg — AB (ref 83.0–108.0)
Patient temperature: 37
pH, Arterial: 7.14 — CL (ref 7.350–7.450)

## 2015-11-04 NOTE — Progress Notes (Signed)
Pharmacy Note  Pantoprazole and metoclopramide changed from PO to IV as requested.

## 2015-11-04 NOTE — Progress Notes (Signed)
After further assessment and further discussion with family, patient with multiorgan failure with high probability with bowel necrosis/bowel ischemia with multiple vasopressors and worsening acidosis despite aggressive bicarb therapy and CRRT.  Wife has agreed that patient would NOT want to be on life support, she has agreed and consented to DNR status. Will otherwise continue aggressive medical management.     Overall, patient is critically ill, prognosis is guarded.  Patient with Multiorgan failure and at high risk for cardiac arrest and death.    Jonathon Bryant, M.D.  [facility]  Medical Director [facility] Director [facility]

## 2015-11-04 NOTE — Progress Notes (Signed)
[place] NOTE  Pharmacy Consult for CRRT medication adjustment    No Known Allergies  Patient Measurements: Height: _0  (177.8 cm) Weight: (!) 322 lb 5 oz (146.2 kg) IBW/kg (Calculated) : 73   Vital Signs: Temp: 98.2 F (36.8 C) (05/06 1000) BP: 32/20 mmHg (05/06 1000) Pulse Rate: 30 (05/06 1000) Intake/Output from previous day: 05/05 0701 - 05/06 0700 In: 5497.9 [I.V.:4657.9; NG/GT:90; IV Piggyback:750] Out: 2782 [Urine:5; Emesis/NG output:750] Intake/Output from this shift: Total I/O In: -  Out: 197 [Other:197] Vent settings for last 24 hours: Vent Mode:  [-] PRVC FiO2 (%):  [60 %-100 %] 60 % Set Rate:  [30 bmp] 30 bmp Vt Set:  [600 mL] 600 mL PEEP:  [10 cmH20] 10 cmH20  Labs:  Recent Labs  10/07/15 0753  10/08/15 0426  10/08/15 1556 10/08/15 2001 2015-10-22 0016 10-22-2015 0417  WBC 26.0*  --  54.7*  --  70.1*  --   --  68.4*  HGB 13.1  --  13.0  --  13.1  --   --  12.9*  HCT 39.0*  --  39.7*  --  42.3  --   --  41.5  PLT 129*  --  185  --  198  --   --  196  CREATININE 2.17*  < > 2.72*  < > 2.63* 2.70* 2.43* 2.45*  MG 2.7*  --   --   --   --   --   --   --   PHOS 4.2  < > 8.1*  < >  --  8.6* 8.8* 9.6*  ALBUMIN  --   < > 2.1*  < > 2.1* 1.9* 1.9* 1.9*  PROT  --   --   --   --  6.8  --   --   --   AST  --   --   --   --  1456*  --   --   --   ALT  --   --   --   --  529*  --   --   --   ALKPHOS  --   --   --   --  119  --   --   --   BILITOT  --   --   --   --  4.4*  --   --   --   < > = values in this interval not displayed. Estimated Creatinine Clearance: 51 mL/min (by C-G formula based on Cr of 2.45).   Recent Labs  Oct 22, 2015 0810 10/22/2015 0849 Oct 22, 2015 1113  GLUCAP 71 [phone] 75    Microbiology: Recent Results (from the past 720 hour(s))  Blood culture (routine x 2)     Status: Abnormal   Collection Time: 09/20/2015 11:15 AM  Result Value Ref Range Status   Specimen Description BLOOD RIGHT ANTECUBITAL  Final   Special  Requests BOTTLES DRAWN AEROBIC AND ANAEROBIC 1CC  Final   Culture  Setup Time   Final    GRAM POSITIVE COCCI IN BOTH AEROBIC AND ANAEROBIC BOTTLES CRITICAL RESULT CALLED TO, READ BACK BY AND VERIFIED WITH: NATE COOKSON AT 0154 10/02/15.PMH CONFIRMED BY [facility]    Culture (A)  Final    STREPTOCOCCUS PNEUMONIAE IN BOTH AEROBIC AND ANAEROBIC BOTTLES    Report Status 10/04/2015 FINAL  Final   Organism ID, Bacteria STREPTOCOCCUS PNEUMONIAE  Final      Susceptibility   Streptococcus pneumoniae - MIC*  PENICILLIN Value in next row       <=0.06SENSITIVE    ERYTHROMYCIN Value in next row Sensitive      <=0.06SENSITIVE    LEVOFLOXACIN Value in next row Sensitive      <=0.06SENSITIVE    VANCOMYCIN Value in next row Sensitive      <=0.06SENSITIVE    CEFTRIAXONE Value in next row Sensitive      <=0.06SENSITIVE    * STREPTOCOCCUS PNEUMONIAE  Blood Culture ID Panel (Reflexed)     Status: Abnormal   Collection Time: 10/03/2015 11:15 AM  Result Value Ref Range Status   Enterococcus species NOT DETECTED NOT DETECTED Final   Vancomycin resistance NOT DETECTED NOT DETECTED Final   Listeria monocytogenes NOT DETECTED NOT DETECTED Final   Staphylococcus species NOT DETECTED NOT DETECTED Final   Staphylococcus aureus NOT DETECTED NOT DETECTED Final   Methicillin resistance NOT DETECTED NOT DETECTED Final   Streptococcus species DETECTED (A) NOT DETECTED Final    Comment: CRITICAL RESULT CALLED TO, READ BACK BY AND VERIFIED WITH: NATE COOKSON AT 0154 10/02/15.PMH    Streptococcus agalactiae NOT DETECTED NOT DETECTED Final   Streptococcus pneumoniae DETECTED (A) NOT DETECTED Final    Comment: CRITICAL RESULT CALLED TO, READ BACK BY AND VERIFIED WITH: NATE COOKSON AT 0154 10/02/15.PMH    Streptococcus pyogenes NOT DETECTED NOT DETECTED Final   Acinetobacter baumannii NOT DETECTED NOT DETECTED Final   Enterobacteriaceae species NOT DETECTED NOT DETECTED Final   Enterobacter cloacae complex NOT DETECTED  NOT DETECTED Final   Escherichia coli NOT DETECTED NOT DETECTED Final   Klebsiella oxytoca NOT DETECTED NOT DETECTED Final   Klebsiella pneumoniae NOT DETECTED NOT DETECTED Final   Proteus species NOT DETECTED NOT DETECTED Final   Serratia marcescens NOT DETECTED NOT DETECTED Final   Carbapenem resistance NOT DETECTED NOT DETECTED Final   Haemophilus influenzae NOT DETECTED NOT DETECTED Final   Neisseria meningitidis NOT DETECTED NOT DETECTED Final   Pseudomonas aeruginosa NOT DETECTED NOT DETECTED Final   Candida albicans NOT DETECTED NOT DETECTED Final   Candida glabrata NOT DETECTED NOT DETECTED Final   Candida krusei NOT DETECTED NOT DETECTED Final   Candida parapsilosis NOT DETECTED NOT DETECTED Final   Candida tropicalis NOT DETECTED NOT DETECTED Final  Blood culture (routine x 2)     Status: Abnormal   Collection Time: 09/22/2015 11:54 AM  Result Value Ref Range Status   Specimen Description BLOOD RIGHT FOREARM  Final   Special Requests BOTTLES DRAWN AEROBIC AND ANAEROBIC 2CC  Final   Culture  Setup Time   Final    GRAM POSITIVE COCCI IN BOTH AEROBIC AND ANAEROBIC BOTTLES CRITICAL RESULT CALLED TO, READ BACK BY AND VERIFIED WITH: NATE COOKSON AT 0154 10/02/15.PMH CONFIRMED BY RWW    Culture (A)  Final    STREPTOCOCCUS PNEUMONIAE IN BOTH AEROBIC AND ANAEROBIC BOTTLES    Report Status 10/05/2015 FINAL  Final   Organism ID, Bacteria STREPTOCOCCUS PNEUMONIAE  Final      Susceptibility   Streptococcus pneumoniae - MIC*    ERYTHROMYCIN <=0.12 SENSITIVE Sensitive     LEVOFLOXACIN 1 SENSITIVE Sensitive     VANCOMYCIN 0.5 SENSITIVE Sensitive     * STREPTOCOCCUS PNEUMONIAE  MRSA PCR Screening     Status: None   Collection Time: 09/21/2015  6:31 PM  Result Value Ref Range Status   MRSA by PCR NEGATIVE NEGATIVE Final    Comment:        The GeneXpert MRSA Assay (FDA approved  for NASAL specimens only), is one component of a comprehensive MRSA colonization surveillance program. It  is not intended to diagnose MRSA infection nor to guide or monitor treatment for MRSA infections.   Culture, respiratory (tracheal aspirate)     Status: None   Collection Time: 09/04/2015  8:02 PM  Result Value Ref Range Status   Specimen Description TRACHEAL ASPIRATE  Final   Special Requests NONE  Final   Gram Stain MANY WBC SEEN RARE GRAM POSITIVE COCCI   Final   Culture NO GROWTH 3 DAYS  Final   Report Status 10/04/2015 FINAL  Final  CULTURE, BLOOD (ROUTINE X 2) w Reflex to PCR ID Panel     Status: None (Preliminary result)   Collection Time: 10/06/15  3:16 PM  Result Value Ref Range Status   Specimen Description BLOOD LEFT ASSIST CONTROL  Final   Special Requests   Final    BOTTLES DRAWN AEROBIC AND ANAEROBIC  AERO 10CC ANA 5CC   Culture NO GROWTH 2 DAYS  Final   Report Status PENDING  Incomplete  CULTURE, BLOOD (ROUTINE X 2) w Reflex to PCR ID Panel     Status: None (Preliminary result)   Collection Time: 10/06/15  3:37 PM  Result Value Ref Range Status   Specimen Description BLOOD RIGHT ASSIST CONTROL  Final   Special Requests   Final    BOTTLES DRAWN AEROBIC AND ANAEROBIC AERO 10CC ANA [place]   Culture NO GROWTH 2 DAYS  Final   Report Status PENDING  Incomplete  Culture, expectorated sputum-assessment     Status: None   Collection Time: 10/07/15 12:34 PM  Result Value Ref Range Status   Specimen Description TRACHEAL ASPIRATE  Final   Special Requests Normal  Final   Sputum evaluation THIS SPECIMEN IS ACCEPTABLE FOR SPUTUM CULTURE  Final   Report Status 10/07/2015 FINAL  Final  Culture, respiratory (NON-Expectorated)     Status: None (Preliminary result)   Collection Time: 10/07/15 12:34 PM  Result Value Ref Range Status   Specimen Description TRACHEAL ASPIRATE  Final   Special Requests Normal Reflexed from [id number]  Final   Gram Stain   Final    NO RED BLOOD CELLS NO WBC SEEN NO ORGANISMS SEEN    Culture NO GROWTH 2 DAYS  Final   Report Status PENDING  Incomplete     Medications:  Scheduled:  . antiseptic oral rinse  7 mL Mouth Rinse QID  . artificial tears  1 application Both Eyes X5T  . budesonide (PULMICORT) nebulizer solution  0.5 mg Nebulization BID  . chlorhexidine gluconate (SAGE KIT)  15 mL Mouth Rinse BID  . dextrose      . hydrocortisone sodium succinate  50 mg Intravenous Q6H  . insulin aspart  2-6 Units Subcutaneous Q4H  . metoCLOPramide (REGLAN) injection  7.5 mg Intravenous Q6H  . metronidazole  500 mg Intravenous Q8H  . pantoprazole (PROTONIX) IV  40 mg Intravenous Daily  . piperacillin-tazobactam (ZOSYN)  IV  3.375 g Intravenous Q8H  . sodium chloride flush  10-40 mL Intracatheter Q12H  . vancomycin  1,000 mg Intravenous Q24H   Infusions:  . dextrose 100 mL/hr at November 06, 2015 0800  . epinephrine 1 mcg/min (11/06/2015 1054)  . HYDROmorphone 2 mg/hr (Nov 06, 2015 0551)  . midazolam (VERSED) infusion 0.5 mg/hr (10/08/15 1349)  . norepinephrine (LEVOPHED) Adult infusion 40 mcg/min (2015/11/06 0841)  . phenylephrine (NEO-SYNEPHRINE) Adult infusion 200 mcg/min (November 06, 2015 0848)  . pureflow 2,500 mL/hr at Nov 06, 2015 0519  .  sodium bicarbonate 150 mEq in sterile water 1000 mL infusion 200 mL/hr at 10/17/15 0848  . vasopressin (PITRESSIN) infusion - *FOR SHOCK* 0.03 Units/min (10/08/15 1245)    Assessment: Pharmacy consulted to adjust medications for CRRT in this 50 y/o M with sepsis and declining renal function.   Plan:  Vancomycin/Zosyn/Metoclopramide CRRT dosing.   No further medication adjustments necessary at this time. Will continue to monitor.   Jonathon Bryant A Oct 17, 2015,12:49 PM

## 2015-11-04 NOTE — Progress Notes (Signed)
ANTICOAGULATION CONSULT NOTE - Initial Consult  Pharmacy Consult for Heparin  Indication: atrial fibrillation  No Known Allergies  Patient Measurements: Height: 5' 10"  (177.8 cm) Weight: (!) 322 lb 5 oz (146.2 kg) IBW/kg (Calculated) : 73 Heparin Dosing Weight: 103.8 kg   Vital Signs: Temp: 99.3 F (37.4 C) (05/06 0500) BP: 49/21 mmHg (05/06 0500) Pulse Rate: 81 (05/06 0500)  Labs:  Recent Labs  10/08/15 0426  10/08/15 1144 10/08/15 1556 10/08/15 2001 10/08/15 2019 13-Oct-2015 0016 10-13-15 0417 10/13/15 0500  HGB 13.0  --   --  13.1  --   --   --  12.9*  --   HCT 39.7*  --   --  42.3  --   --   --  41.5  --   PLT 185  --   --  198  --   --   --  196  --   HEPARINUNFRC 0.74*  --  0.69  --   --  0.43  --   --  0.24*  CREATININE 2.72*  < > 2.82* 2.63* 2.70*  --  2.43* 2.45*  --   < > = values in this interval not displayed.  Estimated Creatinine Clearance: 51 mL/min (by C-G formula based on Cr of 2.45).   Medical History: Past Medical History  Diagnosis Date  . Hypertension   . Cirrhosis ([place])     Medications:  Scheduled:  . antiseptic oral rinse  7 mL Mouth Rinse QID  . artificial tears  1 application Both Eyes [id number]  . aspirin  81 mg Per Tube Daily  . budesonide (PULMICORT) nebulizer solution  0.5 mg Nebulization BID  . chlorhexidine gluconate (SAGE KIT)  15 mL Mouth Rinse BID  . dextrose      . folic acid  1 mg Per Tube Daily  . heparin  1,500 Units Intravenous Once  . hydrocortisone sodium succinate  50 mg Intravenous Q6H  . insulin aspart  2-6 Units Subcutaneous Q4H  . ipratropium-albuterol  3 mL Nebulization Q4H  . lactulose  30 g Per Tube TID  . metoCLOPramide  7.5 mg Oral QID  . metronidazole  500 mg Intravenous Q8H  . pantoprazole sodium  40 mg Per Tube Q1200  . piperacillin-tazobactam  4.5 g Intravenous Q8H  . sodium chloride flush  10-40 mL Intracatheter Q12H  . vancomycin  125 mg Oral Q6H  . vancomycin  1,000 mg Intravenous Q24H     Assessment: 5/5 :  HL @ 11:44 = 0.69           HL @ 20:00 = 0.43   Goal of Therapy:  Heparin level 0.3-0.7 units/ml Monitor platelets by anticoagulation protocol: Yes   Plan:  Continue this pt on current rate of 1450 units/hr and recheck HL on 5/6 with AM labs.   5/6 AM heparin level 0.24. 1500 unit bolus and increase rate to 1650 units/hr. Recheck heparin level in 6 hours.  Prosperity Darrough S 10/13/2015,5:45 AM

## 2015-11-04 NOTE — Plan of Care (Signed)
Problem: Phase I Progression Outcomes Goal: Hemodynamically stable Outcome: Not Progressing Is requiring 3 pressors to maintain adequate BP

## 2015-11-04 NOTE — Progress Notes (Signed)
Informed by Aid that patients FSBG 34mg /dl.  Dr. Belia HemanKasa Made aware and 2 amps of Dextrose 50% were given IV push.  IV fluids changed to D10 at 3550ml/hr.  Awaiting Repeat FSBG.

## 2015-11-04 NOTE — Discharge Summary (Signed)
DEATH/DISCHARGE SUMMARY   ADMISSION DATE: 09/08/2015   PT PROFILE:  [age] M with Htn, NASH admitted with fever, acidosis, AMS, severe sepsis, RLL PNA. Intubated in ED. Developed AFRVR   Patient NOT tolerating TF's, incresing LA, elevated K despite CRRT-patient with distended abd-these findings suggest ISCHEMIC GUT  MAJOR EVENTS/TEST RESULTS: 04/28 Admitted with diagnoses of severe sepsis, acute respiratory failure, suspected PNA, h/o NASH. Developed AFRVR in ED and suffered NSVT while CVL being placed 04/28 Abdominal US: No ascites seen 04/28 TTE: LVEF 50-55%. LA mildly dilated. Normal PA pressures 04/29 BC positive for pneumococcus 04/29 refractory hypoxemia - vent changed to PC mode 04/30 improved oxygenation. AF persists. Full dose enoxaparin initiated 5/2 afib with rvr-amiodarone started 5/3 ct hest b/l infiltrates effusion, worsening resp status and cardiovascular status 5/4 started CRRT, started on multiple vasopressors 5/6 findings to suggest ischemic gut, maximized all vasopressors, on CRRT, increased biCarb infusion started d10 infusion Family updated immediately for impending death  After further discussion with Wife, patient had increased vasopressor requirement, increased WOB, worsening Lactic acidosis depsite aggressive medical care. Wife had agreed and consented to DNR Status, all of family and friends arrived in anticipation of inevitable death.  Patient had decreased HR and BP, patient was PRONOUNCED DEAD @120PM  on 10/25/2015   Lucie LeatherKurian David Keaundre Thelin, M.D.  [facility]  Medical Director [facility] Medical Director [facility] Card[facility]

## 2015-11-04 NOTE — Progress Notes (Signed)
[facility]  ROUNDING NOTE   Subjective:  Patient has worsened significantly in the past 24 hours. He has now become almost completely oliguric. WBC count is up to 68.4. He is on 3 pressors. In addition he has become hyperkalemic despite a 2 potassium bath. He has also been quite acidotic. He is currently requiring 80% FiO2 on the ventilator.    Objective:  Vital signs in last 24 hours:  Temp:  [96.4 F (35.8 C)-99.5 F (37.5 C)] 99.5 F (37.5 C) (05/06 0600) Pulse Rate:  [69-88] 74 (05/06 0600) Resp:  [0-31] 30 (05/06 0600) BP: (47-159)/(16-60) 49/21 mmHg (05/06 0500) SpO2:  [81 %-100 %] 100 % (05/06 0600) Arterial Line BP: (66-148)/(28-47) 135/44 mmHg (05/06 0600) FiO2 (%):  [60 %-100 %] 80 % (05/06 0419) Weight:  [146.2 kg (322 lb 5 oz)] 146.2 kg (322 lb 5 oz) (05/06 0400)  Weight change: 10.7 kg (23 lb 9.4 oz) Filed Weights   10/07/15 0405 10/08/15 0448 10-12-2015 0400  Weight: 133 kg (293 lb 3.4 oz) 135.5 kg (298 lb 11.6 oz) 146.2 kg (322 lb 5 oz)    Intake/Output: I/O last 3 completed shifts: In: 8266.5 [I.V.:6506.5; NG/GT:810; IV Piggyback:950] Out: 6301 [Urine:32; Emesis/NG output:750; SWFUX:3235]   Intake/Output this shift:     Physical Exam: General: Critically ill appearing  Head: Normocephalic, atraumatic. ETT/OG in place  Eyes: Anicteric  Neck: Supple, trachea midline  Lungs:  Coarse BS b/l, vent assisted fio2 80%  Heart: S1S2 no rubs  Abdomen:  Soft, nontender, BS present   Extremities: 2+ peripheral edema.  Neurologic: Not following commands  Skin: Rash b/l UEs  Access:     Basic Metabolic Panel:  Recent Labs Lab 10/07/15 0753  10/08/15 0900 10/08/15 1144 10/08/15 1556 10/08/15 2001 10/12/15 0016 Oct 12, 2015 0417  NA 144  < > 142 140 138 139 138 137  K 3.9  < > 4.7 5.3* 4.9 4.9 5.5* 6.1*  CL 101  < > 102 102 100* 102 101 101  CO2 29  < > _0 GLUCOSE 212*  < > 128* 107* 139* 96 79 54*  BUN 115*  < > 87* 80*  65* 57* 52* 45*  CREATININE 2.17*  < > 2.74* 2.82* 2.63* 2.70* 2.43* 2.45*  CALCIUM 8.4*  < > 8.3* 8.0* 7.6* 7.1* 7.1* 6.9*  MG 2.7*  --   --   --   --   --   --   --   PHOS 4.2  < > 8.3* 9.5*  --  8.6* 8.8* 9.6*  < > = values in this interval not displayed.  Liver Function Tests:  Recent Labs Lab 10/03/15 0425 10/04/15 0548  10/08/15 1144 10/08/15 1556 10/08/15 2001 10-12-15 0016 12-Oct-2015 0417  AST 113* 133*  --   --  1456*  --   --   --   ALT 44 47  --   --  529*  --   --   --   ALKPHOS 61 80  --   --  119  --   --   --   BILITOT 2.5* 2.9*  --   --  4.4*  --   --   --   PROT 5.9* 5.8*  --   --  6.8  --   --   --   ALBUMIN 2.5* 2.3*  < > 2.3* 2.1* 1.9* 1.9* 1.9*  < > = values in this interval not displayed. No results  for input(s): LIPASE, AMYLASE in the last 168 hours.  Recent Labs Lab 10/08/15 1556  AMMONIA 109*    CBC:  Recent Labs Lab 10/06/15 0510 10/07/15 0753 10/08/15 0426 10/08/15 1556 2015-10-22 0417  WBC 20.1* 26.0* 54.7* 70.1* 68.4*  NEUTROABS  --   --   --  55.4* 64.2*  HGB 13.1 13.1 13.0 13.1 12.9*  HCT 38.4* 39.0* 39.7* 42.3 41.5  MCV 104.0* 101.7* 104.6* 109.1* 109.5*  PLT 118* 129* 185 198 196    Cardiac Enzymes:  Recent Labs Lab 10/03/15 0425  TROPONINI 0.12*    BNP: Invalid input(s): POCBNP  CBG:  Recent Labs Lab 10/08/15 1540 10/08/15 2024 10/08/15 2338 22-Oct-2015 0413 Oct 22, 2015 0442  GLUCAP 121* 87 71 45* 10    Microbiology: Results for orders placed or performed during the hospital encounter of 09/10/2015  Blood culture (routine x 2)     Status: Abnormal   Collection Time: 09/18/2015 11:15 AM  Result Value Ref Range Status   Specimen Description BLOOD RIGHT ANTECUBITAL  Final   Special Requests BOTTLES DRAWN AEROBIC AND ANAEROBIC 1CC  Final   Culture  Setup Time   Final    GRAM POSITIVE COCCI IN BOTH AEROBIC AND ANAEROBIC BOTTLES CRITICAL RESULT CALLED TO, READ BACK BY AND VERIFIED WITH: NATE COOKSON AT 0154  10/02/15.PMH CONFIRMED BY RWW    Culture (A)  Final    STREPTOCOCCUS PNEUMONIAE IN BOTH AEROBIC AND ANAEROBIC BOTTLES    Report Status 10/04/2015 FINAL  Final   Organism ID, Bacteria STREPTOCOCCUS PNEUMONIAE  Final      Susceptibility   Streptococcus pneumoniae - MIC*    PENICILLIN Value in next row       <=0.06SENSITIVE    ERYTHROMYCIN Value in next row Sensitive      <=0.06SENSITIVE    LEVOFLOXACIN Value in next row Sensitive      <=0.06SENSITIVE    VANCOMYCIN Value in next row Sensitive      <=0.06SENSITIVE    CEFTRIAXONE Value in next row Sensitive      <=0.06SENSITIVE    * STREPTOCOCCUS PNEUMONIAE  Blood Culture ID Panel (Reflexed)     Status: Abnormal   Collection Time: 09/23/2015 11:15 AM  Result Value Ref Range Status   Enterococcus species NOT DETECTED NOT DETECTED Final   Vancomycin resistance NOT DETECTED NOT DETECTED Final   Listeria monocytogenes NOT DETECTED NOT DETECTED Final   Staphylococcus species NOT DETECTED NOT DETECTED Final   Staphylococcus aureus NOT DETECTED NOT DETECTED Final   Methicillin resistance NOT DETECTED NOT DETECTED Final   Streptococcus species DETECTED (A) NOT DETECTED Final    Comment: CRITICAL RESULT CALLED TO, READ BACK BY AND VERIFIED WITH: NATE COOKSON AT 0154 10/02/15.PMH    Streptococcus agalactiae NOT DETECTED NOT DETECTED Final   Streptococcus pneumoniae DETECTED (A) NOT DETECTED Final    Comment: CRITICAL RESULT CALLED TO, READ BACK BY AND VERIFIED WITH: NATE COOKSON AT 0154 10/02/15.PMH    Streptococcus pyogenes NOT DETECTED NOT DETECTED Final   Acinetobacter baumannii NOT DETECTED NOT DETECTED Final   Enterobacteriaceae species NOT DETECTED NOT DETECTED Final   Enterobacter cloacae complex NOT DETECTED NOT DETECTED Final   Escherichia coli NOT DETECTED NOT DETECTED Final   Klebsiella oxytoca NOT DETECTED NOT DETECTED Final   Klebsiella pneumoniae NOT DETECTED NOT DETECTED Final   Proteus species NOT DETECTED NOT DETECTED  Final   Serratia marcescens NOT DETECTED NOT DETECTED Final   Carbapenem resistance NOT DETECTED NOT DETECTED Final   Haemophilus  influenzae NOT DETECTED NOT DETECTED Final   Neisseria meningitidis NOT DETECTED NOT DETECTED Final   Pseudomonas aeruginosa NOT DETECTED NOT DETECTED Final   Candida albicans NOT DETECTED NOT DETECTED Final   Candida glabrata NOT DETECTED NOT DETECTED Final   Candida krusei NOT DETECTED NOT DETECTED Final   Candida parapsilosis NOT DETECTED NOT DETECTED Final   Candida tropicalis NOT DETECTED NOT DETECTED Final  Blood culture (routine x 2)     Status: Abnormal   Collection Time: 09/18/2015 11:54 AM  Result Value Ref Range Status   Specimen Description BLOOD RIGHT FOREARM  Final   Special Requests BOTTLES DRAWN AEROBIC AND ANAEROBIC 2CC  Final   Culture  Setup Time   Final    GRAM POSITIVE COCCI IN BOTH AEROBIC AND ANAEROBIC BOTTLES CRITICAL RESULT CALLED TO, READ BACK BY AND VERIFIED WITH: NATE COOKSON AT 3149 10/02/15.PMH CONFIRMED BY RWW    Culture (A)  Final    STREPTOCOCCUS PNEUMONIAE IN BOTH AEROBIC AND ANAEROBIC BOTTLES    Report Status 10/05/2015 FINAL  Final   Organism ID, Bacteria STREPTOCOCCUS PNEUMONIAE  Final      Susceptibility   Streptococcus pneumoniae - MIC*    ERYTHROMYCIN <=0.12 SENSITIVE Sensitive     LEVOFLOXACIN 1 SENSITIVE Sensitive     VANCOMYCIN 0.5 SENSITIVE Sensitive     * STREPTOCOCCUS PNEUMONIAE  MRSA PCR Screening     Status: None   Collection Time: 09/26/2015  6:31 PM  Result Value Ref Range Status   MRSA by PCR NEGATIVE NEGATIVE Final    Comment:        The GeneXpert MRSA Assay (FDA approved for NASAL specimens only), is one component of a comprehensive MRSA colonization surveillance program. It is not intended to diagnose MRSA infection nor to guide or monitor treatment for MRSA infections.   Culture, respiratory (tracheal aspirate)     Status: None   Collection Time: 09/24/2015  8:02 PM  Result Value Ref Range  Status   Specimen Description TRACHEAL ASPIRATE  Final   Special Requests NONE  Final   Gram Stain MANY WBC SEEN RARE GRAM POSITIVE COCCI   Final   Culture NO GROWTH 3 DAYS  Final   Report Status 10/04/2015 FINAL  Final  CULTURE, BLOOD (ROUTINE X 2) w Reflex to PCR ID Panel     Status: None (Preliminary result)   Collection Time: 10/06/15  3:16 PM  Result Value Ref Range Status   Specimen Description BLOOD LEFT ASSIST CONTROL  Final   Special Requests   Final    BOTTLES DRAWN AEROBIC AND ANAEROBIC  AERO 10CC ANA 5CC   Culture NO GROWTH 2 DAYS  Final   Report Status PENDING  Incomplete  CULTURE, BLOOD (ROUTINE X 2) w Reflex to PCR ID Panel     Status: None (Preliminary result)   Collection Time: 10/06/15  3:37 PM  Result Value Ref Range Status   Specimen Description BLOOD RIGHT ASSIST CONTROL  Final   Special Requests   Final    BOTTLES DRAWN AEROBIC AND ANAEROBIC AERO 10CC ANA Curran   Culture NO GROWTH 2 DAYS  Final   Report Status PENDING  Incomplete  Culture, expectorated sputum-assessment     Status: None   Collection Time: 10/07/15 12:34 PM  Result Value Ref Range Status   Specimen Description TRACHEAL ASPIRATE  Final   Special Requests Normal  Final   Sputum evaluation THIS SPECIMEN IS ACCEPTABLE FOR SPUTUM CULTURE  Final   Report Status 10/07/2015  FINAL  Final  Culture, respiratory (NON-Expectorated)     Status: None (Preliminary result)   Collection Time: 10/07/15 12:34 PM  Result Value Ref Range Status   Specimen Description TRACHEAL ASPIRATE  Final   Special Requests Normal Reflexed from [id number]  Final   Gram Stain   Final    NO RED BLOOD CELLS NO WBC SEEN NO ORGANISMS SEEN    Culture NO GROWTH < 24 HOURS  Final   Report Status PENDING  Incomplete    Coagulation Studies: No results for input(s): LABPROT, INR in the last 72 hours.  Urinalysis: No results for input(s): COLORURINE, LABSPEC, PHURINE, GLUCOSEU, HGBUR, BILIRUBINUR, KETONESUR, PROTEINUR, UROBILINOGEN,  NITRITE, LEUKOCYTESUR in the last 72 hours.  Invalid input(s): APPERANCEUR    Imaging: Ct Abdomen Pelvis Wo Contrast  10/07/2015  CLINICAL DATA:  Fever, acidosis.  Sepsis. EXAM: CT ABDOMEN AND PELVIS WITHOUT CONTRAST TECHNIQUE: Multidetector CT imaging of the abdomen and pelvis was performed following the standard protocol without IV contrast. COMPARISON:  CT 08/30/2014 FINDINGS: Lower chest: There is bibasilar consolidated airspace disease and small effusions. Hepatobiliary: This nodular contour of the liver. Recanalization of the umbilical veins. Postcholecystectomy. Pancreas: No pancreatic inflammation Spleen: Spleen is minimally enlarged. Adrenal glands are normal. The kidneys, ureters and bladder normal. Adrenals/urinary tract: Adrenal glands and kidneys are normal. Foley catheter within the bladder. Stomach/Bowel: NG tube extends into the stomach. The duodenum, small-bowel, cecum normal. Appendix not identified. The colon contains fluid stool. No obstructing lesion. No evidence of acute inflammation. Rectum normal. Vascular/Lymphatic: Abdominal aorta is normal caliber with atherosclerotic calcification. There is no retroperitoneal or periportal lymphadenopathy. No pelvic lymphadenopathy. Reproductive: Prostate normal.  Foley catheter within the bladder. Other: No free fluid. Musculoskeletal: No aggressive osseous lesion. IMPRESSION: 1. Bibasilar airspace disease concerning for pneumonia versus aspiration pneumonitis. Small effusions. 2. Nodular liver with mild enlarged spleen consistent with cirrhosis and portal hypertension. 3. Fluid stool in the colon. 4. No abdominal or pelvic abscess. Electronically Signed   By: Suzy Bouchard M.D.   On: 10/07/2015 15:38   Ct Head Wo Contrast  10/07/2015  CLINICAL DATA:  Unresponsive patient. Fever, acidosis. Altered mental status. Severe sepsis. EXAM: CT HEAD WITHOUT CONTRAST TECHNIQUE: Contiguous axial images were obtained from the base of the skull through the  vertex without intravenous contrast. COMPARISON:  Brain CT 04/19/2012. FINDINGS: Ventricles and sulci are appropriate for patient's age. No evidence for acute cortically based infarct, intracranial hemorrhage, mass lesion or mass effect. Orbits are unremarkable. Paranasal sinuses are well aerated. Mastoid air cells are well aerated. Calvarium is intact. IMPRESSION: No acute intracranial process. Electronically Signed   By: Lovey Newcomer M.D.   On: 10/07/2015 15:18   US Renal  10/07/2015  CLINICAL DATA:  Acute renal failure EXAM: RENAL / URINARY TRACT ULTRASOUND COMPLETE COMPARISON:  CT scan 10/07/2015 FINDINGS: Right Kidney: Length: 13.6 cm in length. Echogenicity within normal limits. No mass or hydronephrosis visualized. Left Kidney: Length: 13 cm in length. Limited visualization due to patient's large body habitus. Echogenicity within normal limits. No mass or hydronephrosis visualized. Bladder: Urinary bladder is not visualized decompressed by Foley catheter. IMPRESSION: No renal mass or hydronephrosis. No nephrolithiasis. The urinary bladder is not visualized decompressed by Foley catheter. Electronically Signed   By: Lahoma Crocker M.D.   On: 10/07/2015 17:04   US Venous Img Lower Bilateral  10/07/2015  CLINICAL DATA:  Fever of unknown origin. History of smoking. Evaluate for DVT. EXAM: BILATERAL LOWER EXTREMITY VENOUS DOPPLER ULTRASOUND TECHNIQUE: Gray-scale  sonography with graded compression, as well as color Doppler and duplex ultrasound were performed to evaluate the lower extremity deep venous systems from the level of the common femoral vein and including the common femoral, femoral, profunda femoral, popliteal and calf veins including the posterior tibial, peroneal and gastrocnemius veins when visible. The superficial great saphenous vein was also interrogated. Spectral Doppler was utilized to evaluate flow at rest and with distal augmentation maneuvers in the common femoral, femoral and popliteal veins.  COMPARISON:  None. FINDINGS: RIGHT LOWER EXTREMITY Common Femoral Vein: No evidence of thrombus. Normal compressibility, respiratory phasicity and response to augmentation. Saphenofemoral Junction: No evidence of thrombus. Normal compressibility and flow on color Doppler imaging. Profunda Femoral Vein: No evidence of thrombus. Normal compressibility and flow on color Doppler imaging. Femoral Vein: No evidence of thrombus. Normal compressibility, respiratory phasicity and response to augmentation. Popliteal Vein: No evidence of thrombus. Normal compressibility, respiratory phasicity and response to augmentation. Calf Veins: No evidence of thrombus. Normal compressibility and flow on color Doppler imaging. Superficial Great Saphenous Vein: No evidence of thrombus. Normal compressibility and flow on color Doppler imaging. Venous Reflux:  None. Other Findings:  None. LEFT LOWER EXTREMITY Common Femoral Vein: No evidence of thrombus. Normal compressibility, respiratory phasicity and response to augmentation. Saphenofemoral Junction: No evidence of thrombus. Normal compressibility and flow on color Doppler imaging. Profunda Femoral Vein: No evidence of thrombus. Normal compressibility and flow on color Doppler imaging. Femoral Vein: No evidence of thrombus. Normal compressibility, respiratory phasicity and response to augmentation. Popliteal Vein: No evidence of thrombus. Normal compressibility, respiratory phasicity and response to augmentation. Calf Veins: No evidence of thrombus. Normal compressibility and flow on color Doppler imaging. Superficial Great Saphenous Vein: No evidence of thrombus. Normal compressibility and flow on color Doppler imaging. Venous Reflux:  None. Other Findings:  None. IMPRESSION: No evidence of DVT within either lower extremity. Electronically Signed   By: Sandi Mariscal M.D.   On: 10/07/2015 17:05   Dg Chest Port 1 View  10/07/2015  CLINICAL DATA:  Patient status post central line placement.  EXAM: PORTABLE CHEST 1 VIEW COMPARISON:  Chest radiograph 10/07/2015. FINDINGS: Right IJ central venous catheter tip projects over the superior vena cava. ET tube terminates in the mid trachea. Enteric tube courses inferior to the diaphragm. Monitoring leads overlie the patient. New dual-lumen central venous catheter is present with tip projecting over the superior vena cava. Low lung volumes. Stable cardiomegaly. Diffuse bilateral predominately perihilar interstitial opacities and bibasilar airspace opacities. No pleural effusion or pneumothorax. IMPRESSION: New dual-lumen central venous catheter tip projects over the superior vena cava. Otherwise stable support apparatus. Low lung volumes, cardiomegaly, edema and basilar atelectasis. Electronically Signed   By: Lovey Newcomer M.D.   On: 10/07/2015 16:15   Dg Chest Port 1 View  10/07/2015  CLINICAL DATA:  Hypoxia EXAM: PORTABLE CHEST 1 VIEW COMPARISON:  Oct 06, 2015 FINDINGS: Endotracheal tube tip is 4.4 cm above the carina. Central catheter tip is in the superior vena cava. Nasogastric tube tip and side port are below the diaphragm. No pneumothorax. There is patchy airspace disease in both lower lobes, slightly increased bilaterally. There is stable mild cardiomegaly. The pulmonary vascular is within normal limits. No adenopathy evident. IMPRESSION: Tube and catheter positions as described without pneumothorax. Stable cardiac silhouette. Increase in airspace consolidation in both lower lobes compared to 1 day prior. Electronically Signed   By: Lowella Grip III M.D.   On: 10/07/2015 12:21     Medications:   .  dextrose 50 mL/hr at 10/31/15 0447  . heparin 1,650 Units/hr (Oct 31, 2015 0549)  . HYDROmorphone 2 mg/hr (10/31/15 0551)  . midazolam (VERSED) infusion 0.5 mg/hr (10/08/15 1349)  . norepinephrine (LEVOPHED) Adult infusion 29.973 mcg/min (10-31-15 0300)  . phenylephrine (NEO-SYNEPHRINE) Adult infusion 170 mcg/min (31-Oct-2015 0522)  . pureflow 2,500  mL/hr at 10-31-2015 0519  .  sodium bicarbonate 150 mEq in sterile water 1000 mL infusion 100 mL/hr at Oct 31, 2015 0013  . vasopressin (PITRESSIN) infusion - *FOR SHOCK* 0.03 Units/min (10/08/15 1245)   . antiseptic oral rinse  7 mL Mouth Rinse QID  . artificial tears  1 application Both Eyes I6O  . aspirin  81 mg Per Tube Daily  . budesonide (PULMICORT) nebulizer solution  0.5 mg Nebulization BID  . chlorhexidine gluconate (SAGE KIT)  15 mL Mouth Rinse BID  . dextrose      . folic acid  1 mg Per Tube Daily  . hydrocortisone sodium succinate  50 mg Intravenous Q6H  . insulin aspart  2-6 Units Subcutaneous Q4H  . ipratropium-albuterol  3 mL Nebulization Q4H  . lactulose  30 g Per Tube TID  . metoCLOPramide  7.5 mg Oral QID  . metronidazole  500 mg Intravenous Q8H  . pantoprazole sodium  40 mg Per Tube Q1200  . piperacillin-tazobactam  4.5 g Intravenous Q8H  . sodium chloride flush  10-40 mL Intracatheter Q12H  . vancomycin  125 mg Oral Q6H  . vancomycin  1,000 mg Intravenous Q24H   sodium chloride, bisacodyl, heparin, metoprolol, midazolam, sennosides, vecuronium  Assessment/ Plan:  50 y.o. 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  1. acute renal failure secondary to acute tubular necrosis from sepsis. Patient presented with underlying sepsis from Streptococcus pneumonia. Additional contributors to azotemia including use of steroids and high protein tube feeds. Ibuprofen also used for high fevers. - Patient has now become oliguric. Continue continuous renal placement therapy at this moment. If hypotension worsens we may need to consider decreasing or stopping ultrafiltration altogether however he remains quite edematous and also has high oxygenation requirement.  2. Respiratory failure secondary to pneumonia. Patient has Streptococcus pneumonia bacteremia and pneumonia.  -   Awaiting new ABG result this a.m. Continue ventilatory support.  3. Sepsis with Streptococcus pneumoniae septicemia A40.3  continue penicillin G 4.5 g intravenous every 8 hours.  Patient is also seen been started on by mouth and intravenous vancomycin as well as Zosyn and metronidazole. Overall prognosis quite guarded at this moment in time.  4.  Hyperkalemia. This is a very grave's sign. He has developed hyperkalemia while on continuous renal replacement therapy. This may be secondary to digested blood and reabsorption of potassium. Additionally he likely has some ischemic organ system in the body. As above this is very significant and concerning sign.   LOS: 8 LATEEF, MUNSOOR 05-28-177:16 AM

## 2015-11-04 NOTE — Progress Notes (Signed)
Wife at bedside with Dr. Belia HemanKasa, discussing patients prognosis.  Wife agreeable to making patient DNR. Continuing to monitor and adjust vasoactive medications accordingly.

## 2015-11-04 NOTE — Progress Notes (Signed)
Pharmacy Antibiotic Note  Jonathon Bryant is a 50 y.o. male admitted on 01-17-16 with Streptococcus pneumoniae bacteremia. Patient was on penicillin G to which organism is sensitive but continued to spike fevers and chest xray shows increased consolidation. Pharmacy has been consulted for vancomycin and Zosyn dosing. ID consulted.  Plan: Vancomycin changed to 1000 IV every 24 hours since pt was started on CRRT. Continue vanc 1g q 24 hours. Level tomorrow prior to dose.   5/6: (Goal trough on CRRT: 15-25 mcg/ml. Check trough prior to 3rd dose).  Transition to Zosyn 3.375 g IV q8h (4 hour infusion) for CRRT.   Height: 5\' 10"  (177.8 cm) Weight: (!) 322 lb 5 oz (146.2 kg) IBW/kg (Calculated) : 73  Temp (24hrs), Avg:97.6 F (36.4 C), Min:96.4 F (35.8 C), Max:99.5 F (37.5 C)   Recent Labs Lab 10/06/15 0510 10/07/15 0753  10/08/15 0426  10/08/15 1144 10/08/15 1435 10/08/15 1556 10/08/15 2001 10/14/2015 0016 10/21/2015 0417  WBC 20.1* 26.0*  --  54.7*  --   --   --  70.1*  --   --  68.4*  CREATININE 2.21* 2.17*  < > 2.72*  < > 2.82*  --  2.63* 2.70* 2.43* 2.45*  LATICACIDVEN  --   --   --   --   --  5.5* 7.3*  --   --   --   --   < > = values in this interval not displayed.  Estimated Creatinine Clearance: 51 mL/min (by C-G formula based on Cr of 2.45).    No Known Allergies  Antimicrobials this admission: Levaquin 4/28 >> 5/1 Vancomycin 4/28 >> 4/29 Zosyn 4/28 >> 4/28 Ceftriaxone 4/28 >> 4/28 Pen G 5/1 >> 5/4 Vancomycin 5/4 >> Zosyn 5/4 >> Metronidazole 5/5 >>   Dose adjustments this admission:   Microbiology results: 5/4 TA: pending 5/3 BCx: NGTD x 2 4/28 BCx: Strep pneumo x 2 4/28 Sputum (TA): negative  4/28 MRSA PCR: negative  Thank you for allowing pharmacy to be a part of this patient's care.  Bari MantisKristin Mykenzie Ebanks PharmD Clinical Pharmacist  10/12/2015 12:35 PM

## 2015-11-04 NOTE — Progress Notes (Signed)
[place] NOTE  Pharmacy Consult for Hyperglycemia monitoring   No Known Allergies  Patient Measurements: Height: _0  (177.8 cm) Weight: (!) 322 lb 5 oz (146.2 kg) IBW/kg (Calculated) : 73  Vital Signs: Temp: 98.2 F (36.8 C) (05/06 1000) BP: 32/20 mmHg (05/06 1000) Pulse Rate: 30 (05/06 1000) Intake/Output from previous day: 05/05 0701 - 05/06 0700 In: 5497.9 [I.V.:4657.9; NG/GT:90; IV Piggyback:750] Out: 2782 [Urine:5; Emesis/NG output:750] Intake/Output from this shift: Total I/O In: -  Out: 197 [Other:197] Vent settings for last 24 hours: Vent Mode:  [-] PRVC FiO2 (%):  [60 %-100 %] 60 % Set Rate:  [30 bmp] 30 bmp Vt Set:  [600 mL] 600 mL PEEP:  [10 cmH20] 10 cmH20  Labs:  Recent Labs  10/07/15 0753  10/08/15 0426  10/08/15 1556 10/08/15 2001 Oct 18, 2015 0016 10-18-2015 0417  WBC 26.0*  --  54.7*  --  70.1*  --   --  68.4*  HGB 13.1  --  13.0  --  13.1  --   --  12.9*  HCT 39.0*  --  39.7*  --  42.3  --   --  41.5  PLT 129*  --  185  --  198  --   --  196  CREATININE 2.17*  < > 2.72*  < > 2.63* 2.70* 2.43* 2.45*  MG 2.7*  --   --   --   --   --   --   --   PHOS 4.2  < > 8.1*  < >  --  8.6* 8.8* 9.6*  ALBUMIN  --   < > 2.1*  < > 2.1* 1.9* 1.9* 1.9*  PROT  --   --   --   --  6.8  --   --   --   AST  --   --   --   --  1456*  --   --   --   ALT  --   --   --   --  529*  --   --   --   ALKPHOS  --   --   --   --  119  --   --   --   BILITOT  --   --   --   --  4.4*  --   --   --   < > = values in this interval not displayed. Estimated Creatinine Clearance: 51 mL/min (by C-G formula based on Cr of 2.45).   Recent Labs  10-18-2015 0810 Oct 18, 2015 0849 18-Oct-2015 1113  GLUCAP 71 88 75     Medications:  Scheduled:  . antiseptic oral rinse  7 mL Mouth Rinse QID  . artificial tears  1 application Both Eyes W8E  . budesonide (PULMICORT) nebulizer solution  0.5 mg Nebulization BID  . chlorhexidine gluconate (SAGE KIT)  15 mL Mouth Rinse  BID  . dextrose      . hydrocortisone sodium succinate  50 mg Intravenous Q6H  . insulin aspart  2-6 Units Subcutaneous Q4H  . metoCLOPramide (REGLAN) injection  7.5 mg Intravenous Q6H  . metronidazole  500 mg Intravenous Q8H  . pantoprazole (PROTONIX) IV  40 mg Intravenous Daily  . piperacillin-tazobactam (ZOSYN)  IV  3.375 g Intravenous Q8H  . sodium chloride flush  10-40 mL Intracatheter Q12H  . vancomycin  1,000 mg Intravenous Q24H   Infusions:  . dextrose 100 mL/hr at 2015-10-18 0800  . epinephrine 1 mcg/min (  10-23-15 1054)  . HYDROmorphone 2 mg/hr (2015-10-23 0551)  . midazolam (VERSED) infusion 0.5 mg/hr (10/08/15 1349)  . norepinephrine (LEVOPHED) Adult infusion 40 mcg/min (10-23-15 0841)  . phenylephrine (NEO-SYNEPHRINE) Adult infusion 200 mcg/min (23-Oct-2015 0848)  . pureflow 2,500 mL/hr at 2015-10-23 0519  .  sodium bicarbonate 150 mEq in sterile water 1000 mL infusion 200 mL/hr at Oct 23, 2015 0848  . vasopressin (PITRESSIN) infusion - *FOR SHOCK* 0.03 Units/min (10/08/15 1245)    Assessment: Pharmacy consulted to assist in hyperglycemia management in this 50 y/o M with sepsis.   Plan:  Patient ordered SSI q 4 hours-CCU protocol . Will f/u AM.   5/6- Patient with BG drop to 45 and 34 this am. Patient started on D10 drip and given dextrose x 1.  (Patient on Solu-Cortef 16m q6h)  Jonathon Bryant A 5May 20, 201712:52 PM

## 2015-11-04 NOTE — Progress Notes (Signed)
PULMONARY / CRITICAL CARE MEDICINE   Name: Jonathon MusicStephen E Bryant MRN: [id number] DOB: 01/18/1966    ADMISSION DATE:  06/14/15   PT PROFILE:   [age] M with Htn, NASH admitted with fever, acidosis, AMS, severe sepsis,  RLL PNA. Intubated in ED. Developed AFRVR  Subjective now with afib with  RVR, fio2 at 80%, PEEp at 10 Increased WOB on PC mode, remains obtunded vasc cath placed started on CRRT   Patient NOT tolerating TF's, incresing LA, elevated K despite CRRT-patient with distended abd-these findings suggest ISCHEMIC GUT  MAJOR EVENTS/TEST RESULTS: 04/28 Admitted with diagnoses of severe sepsis, acute respiratory failure, suspected PNA, h/o NASH. Developed AFRVR in ED and suffered NSVT while CVL being placed 04/28 Abdominal US: No ascites seen 04/28 TTE: LVEF 50-55%. LA mildly dilated. Normal PA pressures 04/29 BC positive for pneumococcus 04/29 refractory hypoxemia - vent changed to PC mode 04/30 improved oxygenation. AF persists. Full dose enoxaparin initiated 5/2 afib with rvr-amiodarone started 5/3 ct hest b/l infiltrates effusion,  5/4 started CRRT 5/6 findings to suggest ischemic gut  INDWELLING DEVICES:: ETT 04/28 >>  R IJ CVL 04/28 >>  LEFT IJ VASC CATH 5/4  >> RT fem A rt line 5/5>>  MICRO DATA: MRSA PCR 04/28 >> NEG Urine 04/28 >> UA negative Resp  04/28 >>  Blood 04/28 >> 2/2 pneumococcus  PCT 04/28 - 04/30: 19.05, 23.35, 17.25 LA>>5>>7.3 ANTIMICROBIALS:  Vanc 04/28 >> 04/29 Levofloxacin 04/28 >>  Restarted vancomycin/zosyn 5/4>>>> Zosyn 5/5>>> Start IV flagyl 5/6>>  VITAL SIGNS: BP 49/21 mmHg  Pulse 74  Temp(Src) 99.5 F (37.5 C) (Core (Comment))  Resp 30  Ht 5\' 10"  (1.778 m)  Wt 322 lb 5 oz (146.2 kg)  BMI 46.25 kg/m2  SpO2 100%  HEMODYNAMICS: CVP:  [18 mmHg-24 mmHg] 24 mmHg  VENTILATOR SETTINGS: Vent Mode:  [-] PRVC FiO2 (%):  [60 %-100 %] 80 % Set Rate:  [12 bmp-30 bmp] 30 bmp Vt Set:  [500 mL-600 mL] 600 mL PEEP:  [5 cmH20-15 cmH20] 10  cmH20  INTAKE / OUTPUT: I/O last 3 completed shifts: In: 8266.5 [I.V.:6506.5; NG/GT:810; IV Piggyback:950] Out: 3865 [Urine:32; Emesis/NG output:750; Other:3083]  PHYSICAL EXAMINATION: General: Obese, RASS -4, intubated and sedated/NMB +resp distress Neuro: Limited exam due to NMB HEENT: PERRLA, mild scleral edema, ETT, oral mucosa pink Cardiovascular: IRIR in the low 100s, no MRG  Lungs: basilar crackles and rhonchi, mild expiratory wheezes in right upper lung fields Abdomen: obese, distended, tympanic,  Ext: warm, no edema Skin: limited exam, no lesions noted  LABS:  BMET  Recent Labs Lab 10/08/15 2001 10/07/2015 0016 10/10/2015 0417  NA 139 138 137  K 4.9 5.5* 6.1*  CL 102 101 101  CO2 22 22 23   BUN 57* 52* 45*  CREATININE 2.70* 2.43* 2.45*  GLUCOSE 96 79 54*    Electrolytes  Recent Labs Lab 10/07/15 0753  10/08/15 2001 10/31/2015 0016 10/08/2015 0417  CALCIUM 8.4*  < > 7.1* 7.1* 6.9*  MG 2.7*  --   --   --   --   PHOS 4.2  < > 8.6* 8.8* 9.6*  < > = values in this interval not displayed.  CBC  Recent Labs Lab 10/08/15 0426 10/08/15 1556 10/14/2015 0417  WBC 54.7* 70.1* 68.4*  HGB 13.0 13.1 12.9*  HCT 39.7* 42.3 41.5  PLT 185 198 196    Coag's No results for input(s): APTT, INR in the last 168 hours.  Sepsis Markers  Recent Labs Lab 10/03/15 0425  10/04/15 0548 10/08/15 1144 10/08/15 1435  LATICACIDVEN  --   --  5.5* 7.3*  PROCALCITON 17.25 12.03  --   --     ABG  Recent Labs Lab 10/08/15 1425 10/08/15 1600 10/08/15 2001  PHART 7.14* 7.17* 7.23*  PCO2ART 70* 60* 53*  PO2ART 60* 64* 68*    Liver Enzymes  Recent Labs Lab 10/03/15 0425 10/04/15 0548  10/08/15 1556 10/08/15 2001 10/10/2015 0016 10/25/2015 0417  AST 113* 133*  --  1456*  --   --   --   ALT 44 47  --  529*  --   --   --   ALKPHOS 61 80  --  119  --   --   --   BILITOT 2.5* 2.9*  --  4.4*  --   --   --   ALBUMIN 2.5* 2.3*  < > 2.1* 1.9* 1.9* 1.9*  < > = values in this  interval not displayed.  Cardiac Enzymes  Recent Labs Lab 10/03/15 0425  TROPONINI 0.12*    Glucose  Recent Labs Lab 10/08/15 1540 10/08/15 2024 10/08/15 2338 10/22/2015 0413 10/15/2015 0442 10/29/2015 0728  GLUCAP 121* 87 71 45* 77 34*    No results found.   Discussion 50 yo male with a PMH  HTN, NASH admitted with fever, acidosis, AMS, severe sepsis secondary to strep pneumonia; now intubated and sedated; findings with increasing LA, with elevataed K despite CRRT with distended ABD and not tolerating TF's findings c/w BOWEL ISCHEMIA/NECROSIS  ASSESSMENT / PLAN:  PULMONARY A:  Acute hypoxic respiratory failure Severe hypoxemia-still requiring >50% FiO2 RLL CAP - pneumococcal Smoker without h/o COPD findings suggest COPD  para-pneumonic effusion P:   Cont full vent support with PC mode - settings reviewed and/or adjusted Cont vent bundle Daily SBT if/when meets criteria Daily CXR ABG pending  CARDIOVASCULAR A:  New onset AFRVR - controlled with metoprolol Transient septic shock, resolved H/O hypertension P:  Monitor rhythm and BP on amiodarone  STOP heparin infusion   RENAL A:   AKI, nonoliguric Lactic acidosis - mild, due to severe sepsis P:   Monitor BMET intermittently Monitor I/Os Correct electrolytes as indicated Avoid nephrotoxic medications  GASTROINTESTINAL A:   Obesity NASH P:   SUP: enteral PPI Hold TF's [place] ISchemic GUT/Bowel Necrosis -prognosis is very poor.   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 P:  DVT px: full dose LMWH for AF Monitor CBC intermittently Transfuse per usual guidelines  INFECTIOUS A:   Severe sepsis-persistent fever RLL pneumococcal PNA Possible R para-pneumonic effusion Minimal fluid seen on [place] of chest 04/30 Still with fevers, now with rash, ID consulted P:   Monitor temp, WBC count increasing Micro and abx as above ID consulted  ENDOCRINE A:   No issues    P:   Monitor CBGs Consider SSI if glu > 180  NEUROLOGIC A:   Acute metabolic encephalopathy  Hyperammonemia  P:   RASS goal: -1, -2   I have personally reviewed/obtained a history, examined the patient, evaluated Pertinent laboratory and RadioGraphic/imaging results, and  formulated the assessment and plan   The Patient requires high complexity decision making for assessment and support, frequent evaluation and titration of therapies, application of advanced monitoring technologies and extensive interpretation of multiple databases.  Critical Care Time devoted to patient care services described in this note is 40 minutes.    Overall, patient is critically ill, prognosis is guarded.  Patient with Multiorgan failure  and at high risk for cardiac arrest and death.  Will need to discuss with wife that patient will likely not survive this admission, will need to establish DNR status  Adryanna Friedt Santiago Glad, M.D.  [facility]  Medical Director [facility] Medical Director [facility] Cardio-Pulmonary Department

## 2015-11-04 DEATH — deceased

## 2017-05-29 IMAGING — DX DG CHEST 1V
1 series · 1 of 1 positions shown · non-contrast
Comparison: Chest x-ray from earlier same day.

CLINICAL DATA: Central line placement and intubation.

EXAM:
CHEST 1 VIEW

[chest ap]
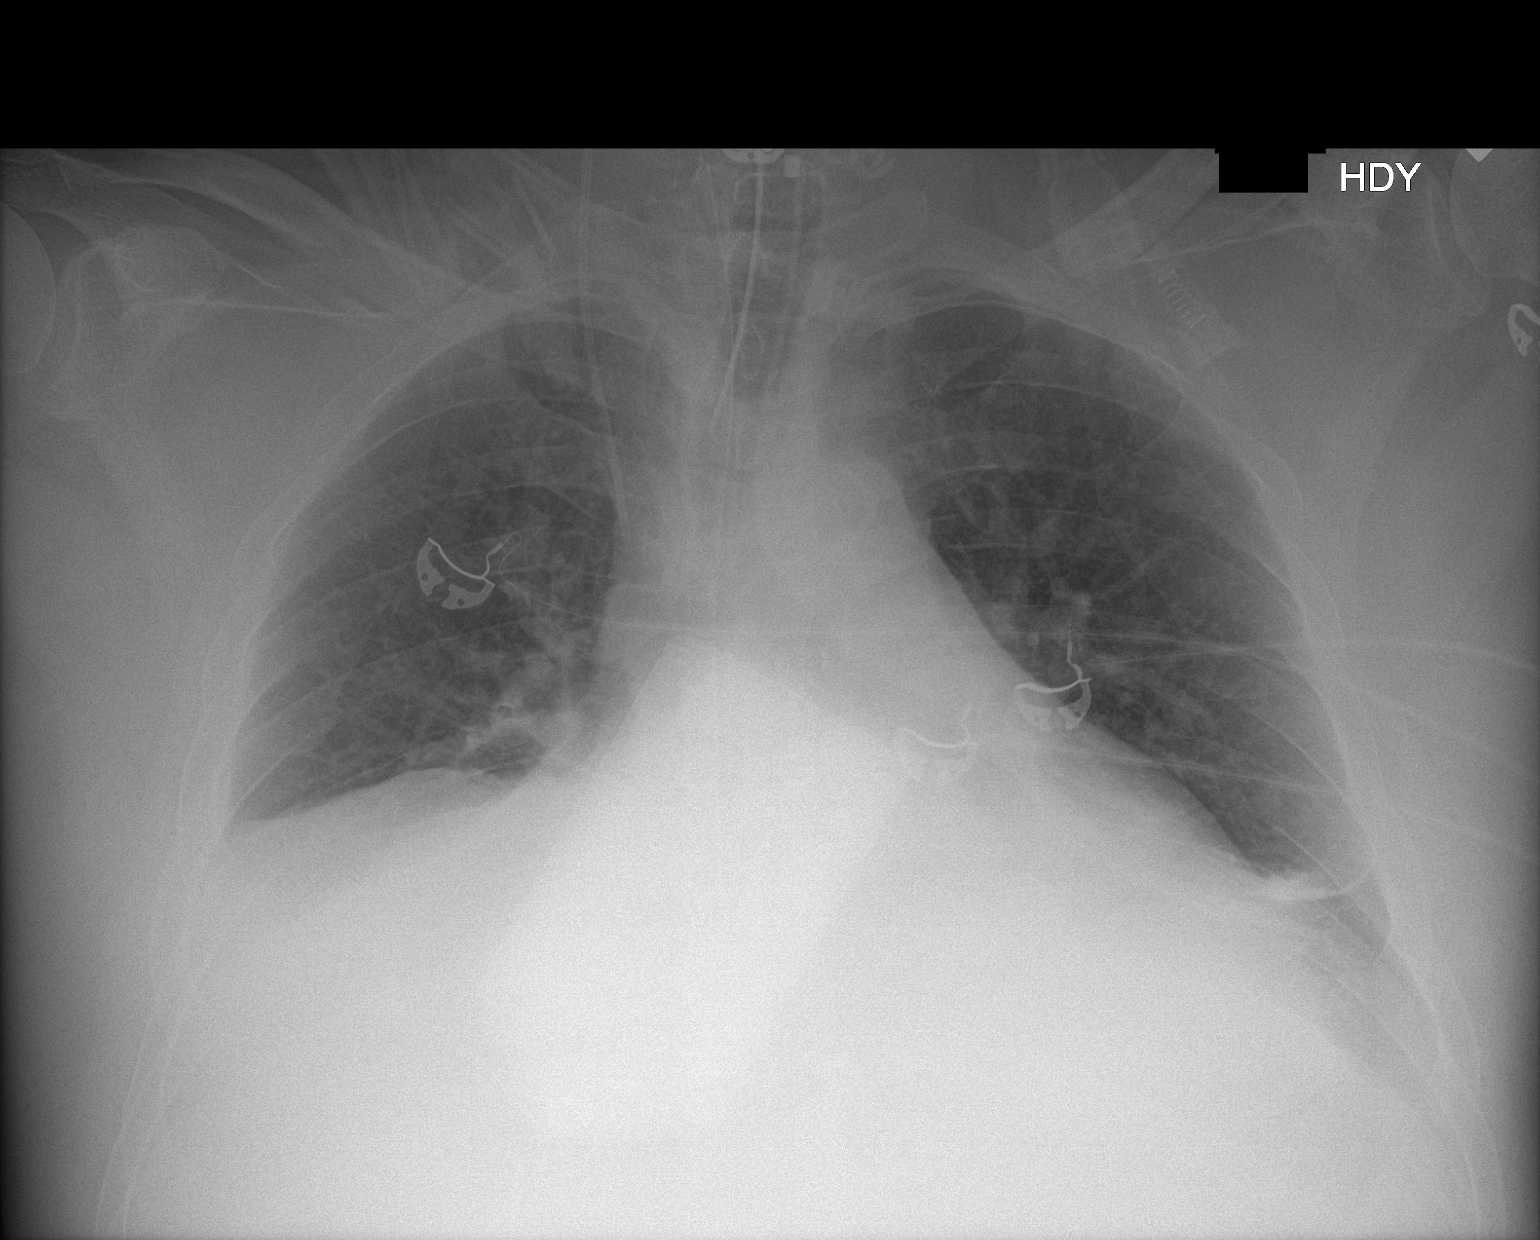

[1 of 1 positions shown; findings below may reference images not displayed]

FINDINGS: Endotracheal tube is well positioned with tip just above the level
of the carina. Right IJ central line is well positioned with tip
overlying the mid SVC.

No pneumothorax seen. Study is hypoinspiratory. The bibasilar
opacities seen on the chest x-ray from earlier same day are
difficult to characterize due to the low lung volumes. Heart size
appears grossly stable.
IMPRESSION: 1. Endotracheal tube well positioned with tip just above the level
of the carina.
2. Right-sided central line well positioned with tip at the level of
the mid SVC.
3. No procedural related complication seen.

## 2017-05-29 IMAGING — DX DG ABD PORTABLE 1V
1 series · 1 of 1 positions shown · non-contrast
Comparison: None.

CLINICAL DATA: Orogastric tube placement

EXAM:
PORTABLE ABDOMEN - 1 VIEW

[abdomen kub]
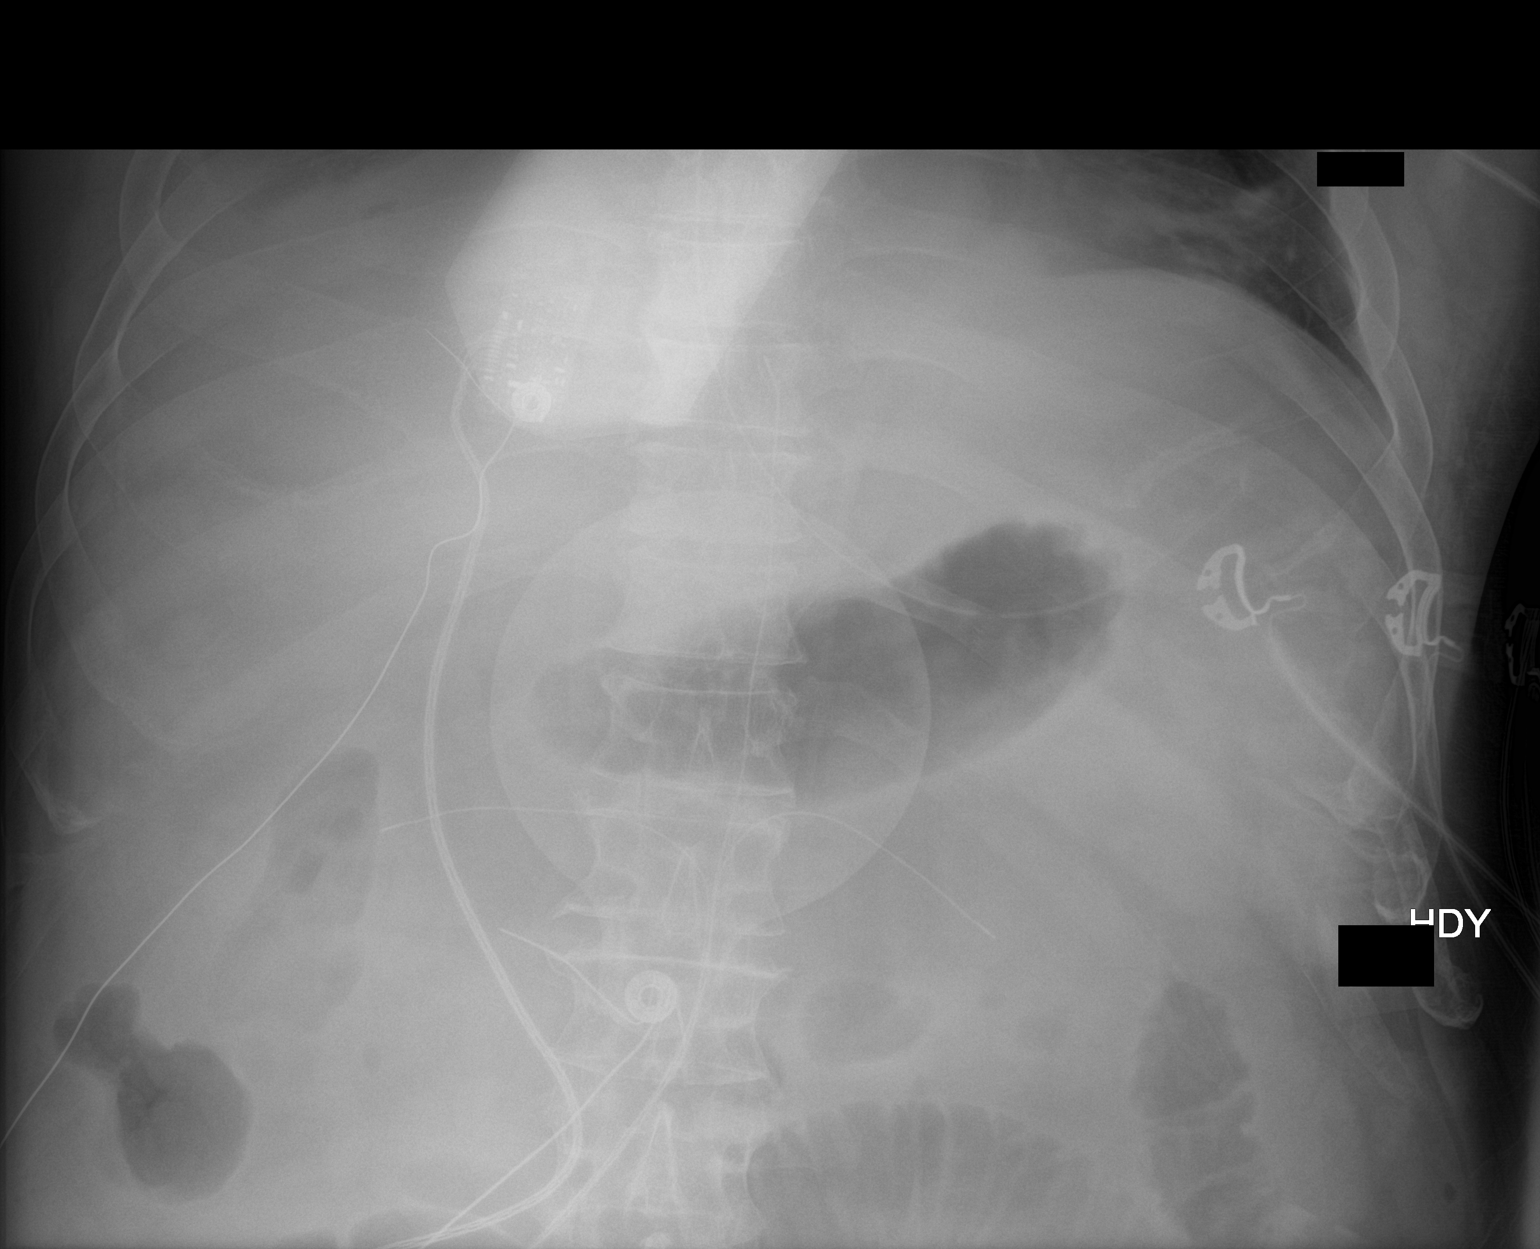

[1 of 1 positions shown; findings below may reference images not displayed]

FINDINGS: NG tube projects in left upper quadrant over the stomach. There are
a few mildly dilated loops of small bowel present.
IMPRESSION: Orogastric tube projects over the stomach.

## 2017-06-02 IMAGING — CT CT CHEST W/O CM
1 series · 14 of 34 positions shown, 18 images · non-contrast
Comparison: Chest radiograph from one day prior. 03/23/2014 chest
CT angiogram.

CLINICAL DATA: 50-year-old male inpatient admitted with fever,
sepsis and strep pneumonia, intubated requiring Paulus N Ceejay2.

EXAM:
CT CHEST WITHOUT CONTRAST
TECHNIQUE: Multidetector CT imaging of the chest was performed following the
standard protocol without IV contrast.

[Series 2: routine chest wo · axial · 0.79mm/px · z∈[-377,-135]mm · 14 of 143 slices shown, 18 images]
[im 11/143  mediastinal]
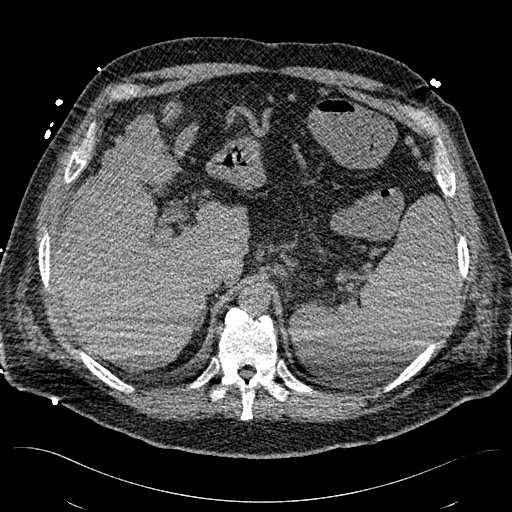
[im 11/143  lung]
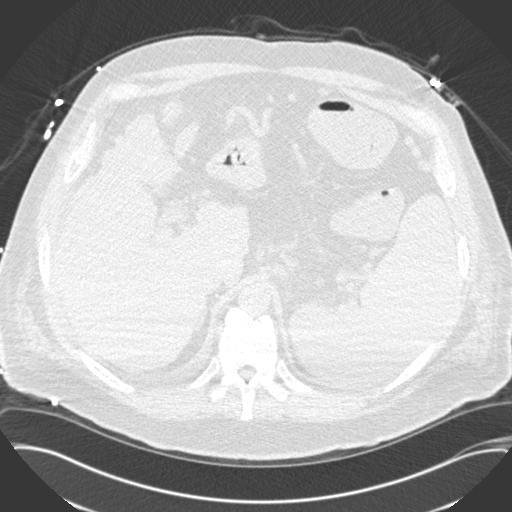
[im 22/143  lung]
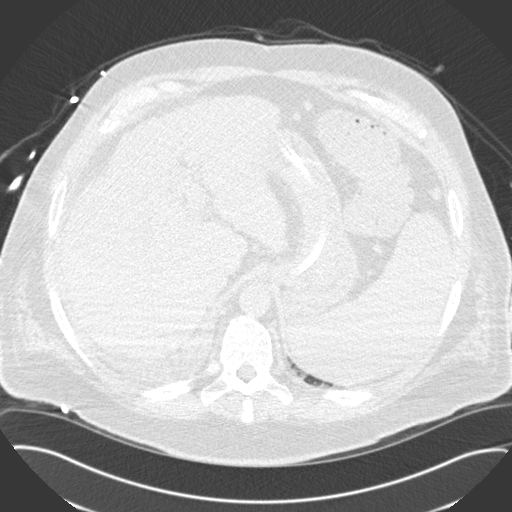
[im 29/143  lung]
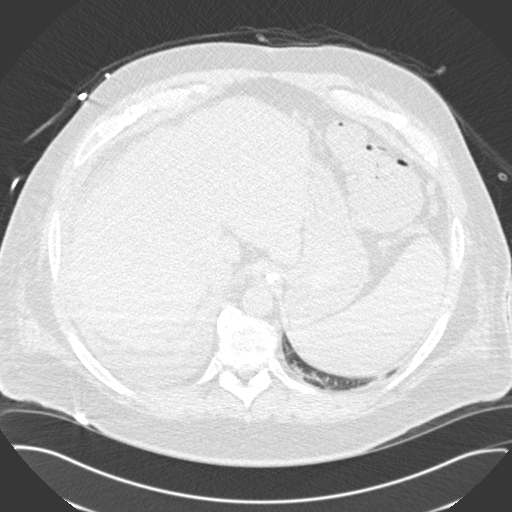
[im 43/143  lung]
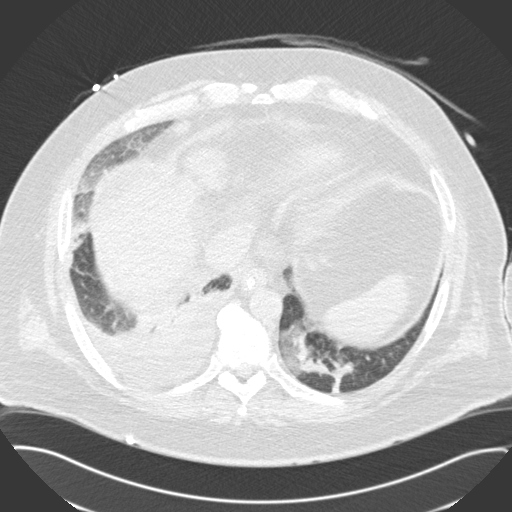
[im 53/143  mediastinal]
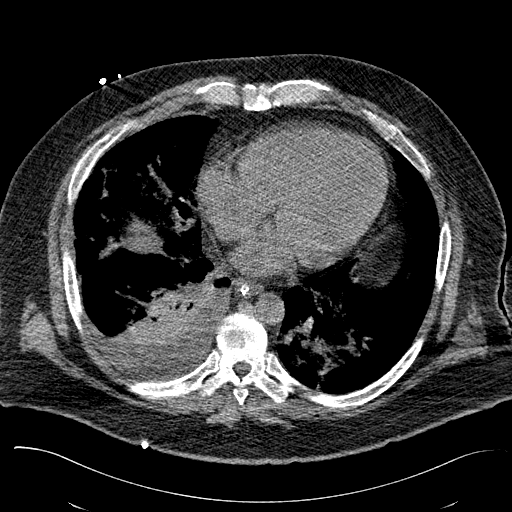
[im 53/143  lung]
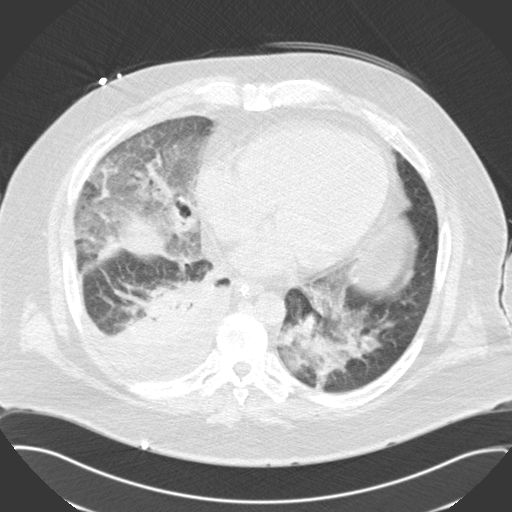
[im 58/143  lung]
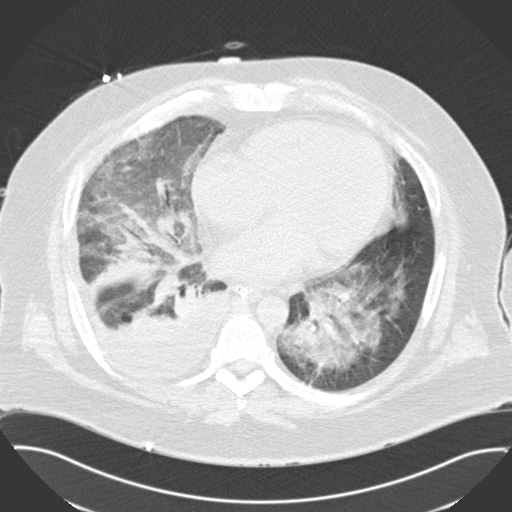
[im 67/143  lung]
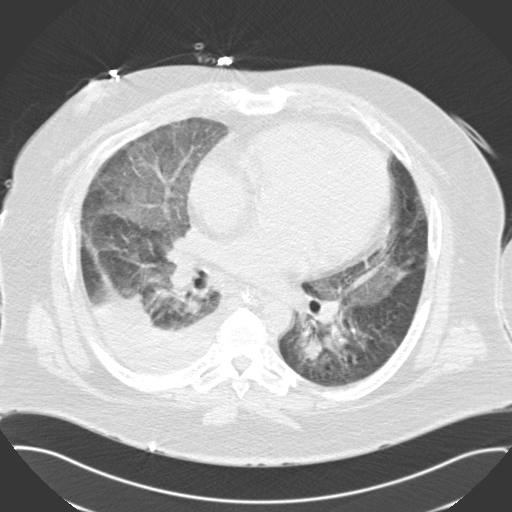
[im 77/143  lung]
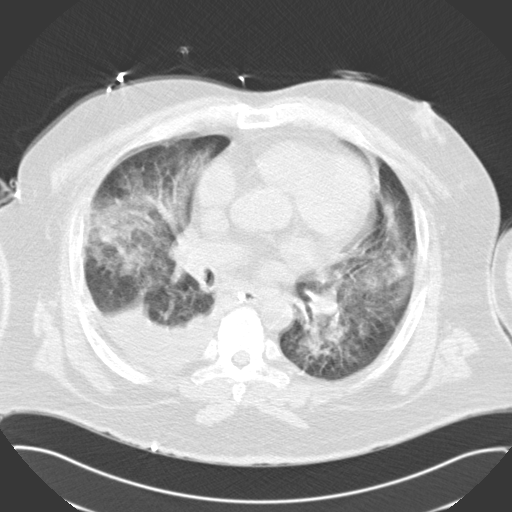
[im 85/143  mediastinal]
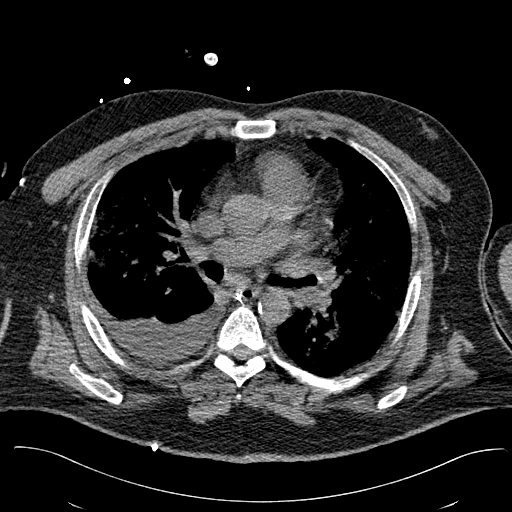
[im 85/143  lung]
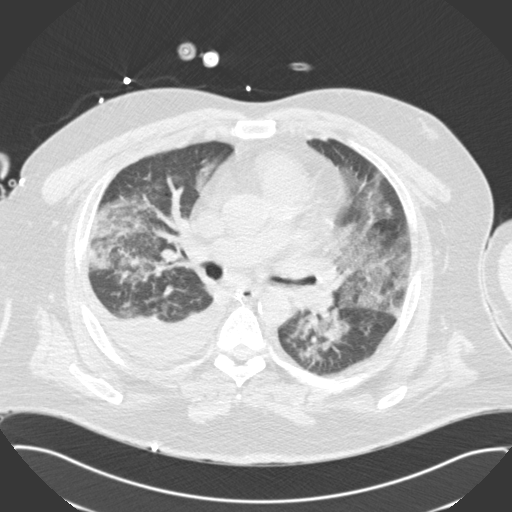
[im 90/143  lung]
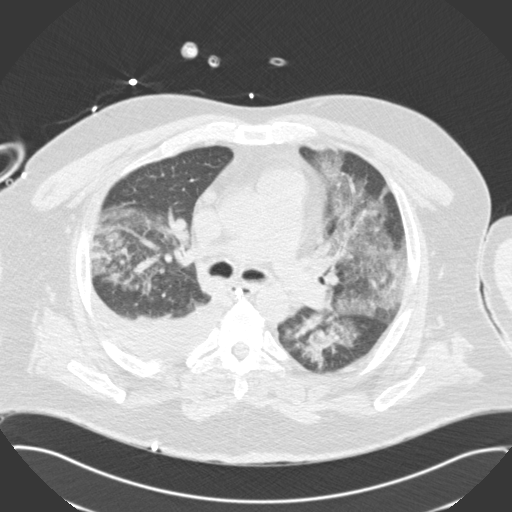
[im 106/143  lung]
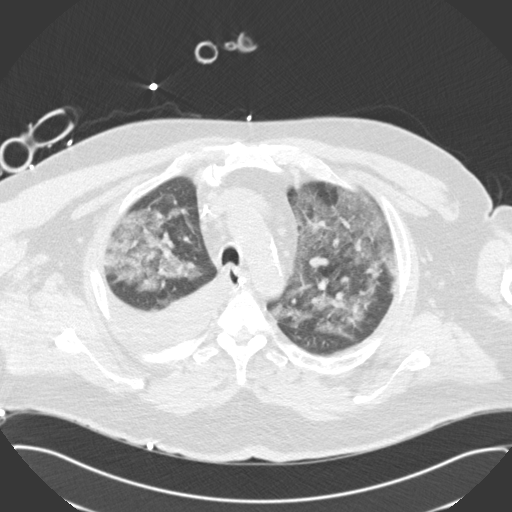
[im 114/143  lung]
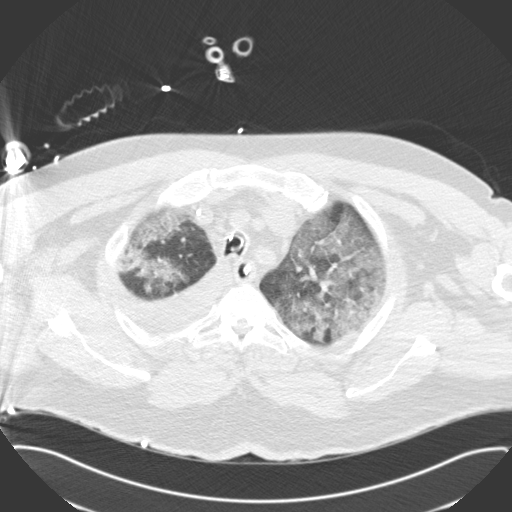
[im 121/143  mediastinal]
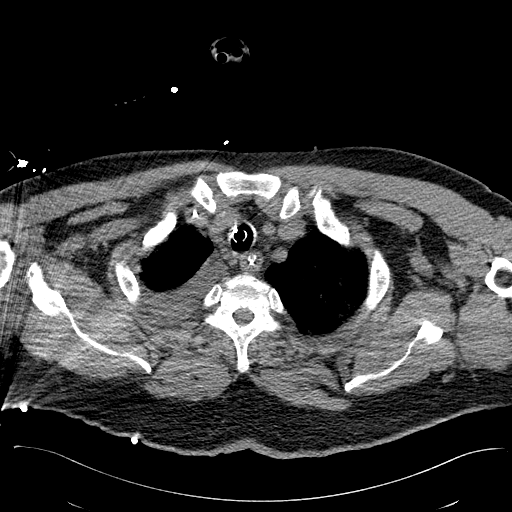
[im 121/143  lung]
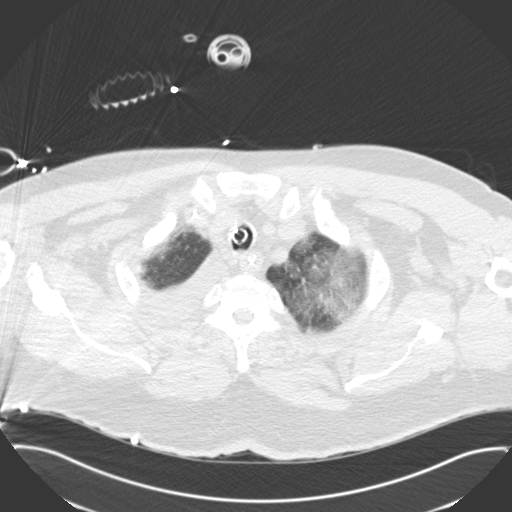
[im 132/143  lung]
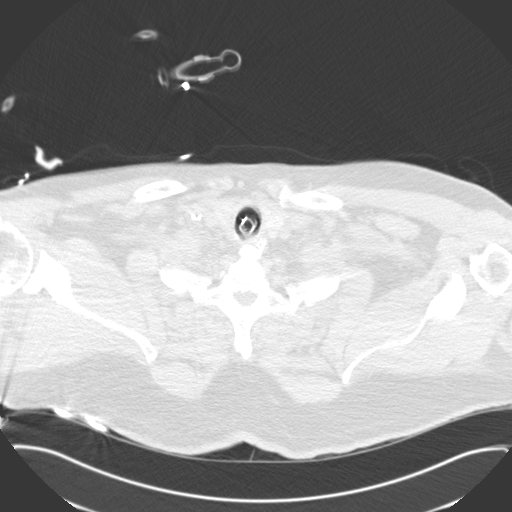

[14 of 34 positions shown; findings below may reference images not displayed]

FINDINGS: Motion degraded study.

Mediastinum/Nodes: Normal heart size. No pericardial
fluid/thickening. Left main, left anterior descending, left
circumflex and right coronary atherosclerosis. Atherosclerotic
nonaneurysmal thoracic aorta. Normal caliber pulmonary arteries.
Normal visualized thyroid. Enteric tube terminates in the distal
body of the stomach. No appreciable esophageal wall thickening. No
axillary adenopathy. Mild right paratracheal, AP window and
subcarinal lymphadenopathy, for example a 1.3 cm right paratracheal
node (series 2/image 40), a 1.0 cm AP window node (series 2/ image
42) and a 1.2 cm subcarinal node (series 2/image 60), all new since
03/23/2014. No gross hilar adenopathy on this noncontrast study.

Lungs/Pleura: Endotracheal tube is well positioned with the tip
cm above the carina. No pneumothorax. Layering small right pleural
effusion. No left pleural effusion. There is extensive patchy
consolidation and ground-glass opacity throughout both lungs
involving all lung lobes. There is volume loss in the dependent
right lower lobe in keeping with mild compressive atelectasis. No
significant pulmonary nodules are seen in the aerated portions of
the lungs.

Upper abdomen: Diffusely irregular liver surface, consistent with
cirrhosis. Trace upper abdominal ascites. Partially visualized
splenomegaly. Top-normal size gastrohepatic ligament nodes measuring
up to 0.9 cm appear stable.

Musculoskeletal: No aggressive appearing focal osseous lesions. Mild
degenerative changes in the thoracic spine. Symmetric mild
gynecomastia.
IMPRESSION: 1. Extensive patchy consolidation and ground-glass opacity
throughout both lungs involving all lung lobes, most suggestive of
severe multifocal pneumonia and/or acute interstitial pneumonia
(KIERSTYN)/ADLAHO.
2. Small right pleural effusion.
3. Mild mediastinal lymphadenopathy is nonspecific, probably
reactive.
4. Cirrhosis. Partially visualized splenomegaly. Trace ascites in
the upper abdomen.
5. Left main and 3 vessel coronary atherosclerosis.
6. Well-positioned enteric and endotracheal tubes.
